# Patient Record
Sex: Female | Born: 1985 | Race: White | Hispanic: No | Marital: Married | Smoking: Never smoker
Health system: Southern US, Community
[De-identification: ages and names within clinical notes are randomized; demographics above are authoritative.]

## PROBLEM LIST (undated history)

## (undated) DIAGNOSIS — R51 Headache: Secondary | ICD-10-CM

## (undated) DIAGNOSIS — R87619 Unspecified abnormal cytological findings in specimens from cervix uteri: Secondary | ICD-10-CM

## (undated) DIAGNOSIS — R87629 Unspecified abnormal cytological findings in specimens from vagina: Secondary | ICD-10-CM

## (undated) DIAGNOSIS — Z8619 Personal history of other infectious and parasitic diseases: Secondary | ICD-10-CM

## (undated) DIAGNOSIS — B999 Unspecified infectious disease: Secondary | ICD-10-CM

## (undated) DIAGNOSIS — IMO0002 Reserved for concepts with insufficient information to code with codable children: Secondary | ICD-10-CM

## (undated) HISTORY — PX: CRYOTHERAPY: SHX1416

## (undated) HISTORY — PX: WISDOM TOOTH EXTRACTION: SHX21

## (undated) HISTORY — DX: Headache: R51

## (undated) HISTORY — DX: Personal history of other infectious and parasitic diseases: Z86.19

---
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
## 2007-08-09 ENCOUNTER — Inpatient Hospital Stay (HOSPITAL_COMMUNITY): Admission: AD | Admit: 2007-08-09 | Discharge: 2007-08-10 | Payer: Self-pay | Admitting: Obstetrics and Gynecology

## 2007-08-20 ENCOUNTER — Inpatient Hospital Stay (HOSPITAL_COMMUNITY): Admission: RE | Admit: 2007-08-20 | Discharge: 2007-08-22 | Payer: Self-pay | Admitting: Obstetrics and Gynecology

## 2010-02-06 NOTE — L&D Delivery Note (Signed)
Delivery Note At  a viable female was delivered via OA APGAR:9 9   Placenta status:del spontaneously and intact , .  Cord:  with the following complications: none  Anesthesia:  Epidural Episiotomy: none Lacerations: none Suture Repair: not needed Est. Blood Loss (mL): 300  Mom to postpartum.  Baby to nursery-stable.  Korri Ask L 12/15/2010, 3:13 PM

## 2010-05-10 LAB — RUBELLA ANTIBODY, IGM: Rubella: IMMUNE

## 2010-05-10 LAB — ABO/RH: RH Type: POSITIVE

## 2010-05-10 LAB — STREP B DNA PROBE: GBS: POSITIVE

## 2010-05-10 LAB — HEPATITIS B SURFACE ANTIGEN: Hepatitis B Surface Ag: NEGATIVE

## 2010-11-03 LAB — CBC
MCV: 100.2 — ABNORMAL HIGH
RBC: 3.21 — ABNORMAL LOW
WBC: 11.7 — ABNORMAL HIGH

## 2010-11-03 LAB — RPR: RPR Ser Ql: NONREACTIVE

## 2010-11-04 LAB — CBC
HCT: 26.9 — ABNORMAL LOW
Hemoglobin: 9.5 — ABNORMAL LOW
RDW: 13.2
WBC: 16.6 — ABNORMAL HIGH

## 2010-12-15 ENCOUNTER — Inpatient Hospital Stay (HOSPITAL_COMMUNITY): Payer: 59 | Admitting: Anesthesiology

## 2010-12-15 ENCOUNTER — Encounter (HOSPITAL_COMMUNITY): Payer: Self-pay | Admitting: Anesthesiology

## 2010-12-15 ENCOUNTER — Encounter (HOSPITAL_COMMUNITY): Payer: Self-pay

## 2010-12-15 ENCOUNTER — Inpatient Hospital Stay (HOSPITAL_COMMUNITY)
Admission: AD | Admit: 2010-12-15 | Discharge: 2010-12-17 | DRG: 775 | Disposition: A | Discharge status: Home / Self Care | Payer: 59 | Source: Ambulatory Visit | Attending: Obstetrics and Gynecology | Admitting: Obstetrics and Gynecology

## 2010-12-15 DIAGNOSIS — O480 Post-term pregnancy: Principal | ICD-10-CM | POA: Diagnosis present

## 2010-12-15 HISTORY — DX: Reserved for concepts with insufficient information to code with codable children: IMO0002

## 2010-12-15 HISTORY — DX: Unspecified abnormal cytological findings in specimens from cervix uteri: R87.619

## 2010-12-15 LAB — CBC
Hemoglobin: 11.4 g/dL — ABNORMAL LOW (ref 12.0–15.0)
MCH: 33.7 pg (ref 26.0–34.0)
MCV: 100 fL (ref 78.0–100.0)
RBC: 3.38 MIL/uL — ABNORMAL LOW (ref 3.87–5.11)

## 2010-12-15 MED ORDER — LACTATED RINGERS IV SOLN
INTRAVENOUS | Status: DC
  Administered 2010-12-15: 10:00:00 via INTRAVENOUS

## 2010-12-15 MED ORDER — DIPHENHYDRAMINE HCL 50 MG/ML IJ SOLN: 12.5000 mg | INTRAMUSCULAR | Status: DC | PRN

## 2010-12-15 MED ORDER — ALBUTEROL SULFATE HFA 108 (90 BASE) MCG/ACT IN AERS: 1.0000 | INHALATION_SPRAY | Freq: Four times a day (QID) | RESPIRATORY_TRACT | Status: DC | PRN

## 2010-12-15 MED ORDER — ONDANSETRON HCL 4 MG/2ML IJ SOLN: 4.0000 mg | INTRAMUSCULAR | Status: DC | PRN

## 2010-12-15 MED ORDER — WITCH HAZEL-GLYCERIN EX PADS: 1.0000 "application " | MEDICATED_PAD | CUTANEOUS | Status: DC | PRN

## 2010-12-15 MED ORDER — PRENATAL PLUS 27-1 MG PO TABS
1.0000 | ORAL_TABLET | Freq: Every day | ORAL | Status: DC
  Administered 2010-12-15 – 2010-12-16 (×2): 1 via ORAL
  Filled 2010-12-15 (×2): qty 1

## 2010-12-15 MED ORDER — TETANUS-DIPHTH-ACELL PERTUSSIS 5-2.5-18.5 LF-MCG/0.5 IM SUSP
0.5000 mL | Freq: Once | INTRAMUSCULAR | Status: DC
  Filled 2010-12-15: qty 0.5

## 2010-12-15 MED ORDER — LACTATED RINGERS IV SOLN
500.0000 mL | Freq: Once | INTRAVENOUS | Status: AC
  Administered 2010-12-15: 500 mL via INTRAVENOUS

## 2010-12-15 MED ORDER — MEASLES, MUMPS & RUBELLA VAC ~~LOC~~ INJ: 0.5000 mL | INJECTION | Freq: Once | SUBCUTANEOUS | Status: DC

## 2010-12-15 MED ORDER — LIDOCAINE HCL (PF) 1 % IJ SOLN
30.0000 mL | INTRAMUSCULAR | Status: DC | PRN
  Filled 2010-12-15: qty 30

## 2010-12-15 MED ORDER — OXYCODONE-ACETAMINOPHEN 5-325 MG PO TABS: 2.0000 | ORAL_TABLET | ORAL | Status: DC | PRN

## 2010-12-15 MED ORDER — DIBUCAINE 1 % RE OINT: 1.0000 "application " | TOPICAL_OINTMENT | RECTAL | Status: DC | PRN

## 2010-12-15 MED ORDER — FLEET ENEMA 7-19 GM/118ML RE ENEM: 1.0000 | ENEMA | RECTAL | Status: DC | PRN

## 2010-12-15 MED ORDER — MEDROXYPROGESTERONE ACETATE 150 MG/ML IM SUSP: 150.0000 mg | INTRAMUSCULAR | Status: DC | PRN

## 2010-12-15 MED ORDER — LACTATED RINGERS IV SOLN
INTRAVENOUS | Status: DC
  Administered 2010-12-15: 14:00:00 via INTRAVENOUS

## 2010-12-15 MED ORDER — EPHEDRINE 5 MG/ML INJ: 10.0000 mg | INTRAVENOUS | Status: DC | PRN

## 2010-12-15 MED ORDER — DIPHENHYDRAMINE HCL 25 MG PO CAPS: 25.0000 mg | ORAL_CAPSULE | Freq: Four times a day (QID) | ORAL | Status: DC | PRN

## 2010-12-15 MED ORDER — BISACODYL 10 MG RE SUPP: 10.0000 mg | Freq: Every day | RECTAL | Status: DC | PRN

## 2010-12-15 MED ORDER — SIMETHICONE 80 MG PO CHEW: 80.0000 mg | CHEWABLE_TABLET | ORAL | Status: DC | PRN

## 2010-12-15 MED ORDER — PHENYLEPHRINE 40 MCG/ML (10ML) SYRINGE FOR IV PUSH (FOR BLOOD PRESSURE SUPPORT)
PREFILLED_SYRINGE | INTRAVENOUS | Status: AC
  Filled 2010-12-15: qty 5

## 2010-12-15 MED ORDER — ACETAMINOPHEN 325 MG PO TABS: 650.0000 mg | ORAL_TABLET | ORAL | Status: DC | PRN

## 2010-12-15 MED ORDER — PHENYLEPHRINE 40 MCG/ML (10ML) SYRINGE FOR IV PUSH (FOR BLOOD PRESSURE SUPPORT): 80.0000 ug | PREFILLED_SYRINGE | INTRAVENOUS | Status: DC | PRN

## 2010-12-15 MED ORDER — FENTANYL 2.5 MCG/ML BUPIVACAINE 1/10 % EPIDURAL INFUSION (WH - ANES): 14.0000 mL/h | INTRAMUSCULAR | Status: DC

## 2010-12-15 MED ORDER — EPHEDRINE 5 MG/ML INJ
INTRAVENOUS | Status: AC
  Filled 2010-12-15: qty 4

## 2010-12-15 MED ORDER — SODIUM CHLORIDE 0.9 % IV SOLN
1.0000 g | Freq: Four times a day (QID) | INTRAVENOUS | Status: DC
  Administered 2010-12-15: 1 g via INTRAVENOUS
  Filled 2010-12-15 (×3): qty 1000

## 2010-12-15 MED ORDER — LANOLIN HYDROUS EX OINT: TOPICAL_OINTMENT | CUTANEOUS | Status: DC | PRN

## 2010-12-15 MED ORDER — CITRIC ACID-SODIUM CITRATE 334-500 MG/5ML PO SOLN: 30.0000 mL | ORAL | Status: DC | PRN

## 2010-12-15 MED ORDER — ZOLPIDEM TARTRATE 5 MG PO TABS: 5.0000 mg | ORAL_TABLET | Freq: Every evening | ORAL | Status: DC | PRN

## 2010-12-15 MED ORDER — FENTANYL 2.5 MCG/ML BUPIVACAINE 1/10 % EPIDURAL INFUSION (WH - ANES)
INTRAMUSCULAR | Status: AC
  Filled 2010-12-15: qty 60

## 2010-12-15 MED ORDER — CARBOPROST TROMETHAMINE 250 MCG/ML IM SOLN
250.0000 ug | Freq: Once | INTRAMUSCULAR | Status: AC
  Administered 2010-12-15: 250 ug via INTRAMUSCULAR

## 2010-12-15 MED ORDER — SODIUM BICARBONATE 8.4 % IV SOLN
INTRAVENOUS | Status: DC | PRN
  Administered 2010-12-15: 5 mL via EPIDURAL

## 2010-12-15 MED ORDER — CARBOPROST TROMETHAMINE 250 MCG/ML IM SOLN
INTRAMUSCULAR | Status: AC
  Administered 2010-12-15: 250 ug via INTRAMUSCULAR
  Filled 2010-12-15: qty 1

## 2010-12-15 MED ORDER — LACTATED RINGERS IV SOLN: 500.0000 mL | INTRAVENOUS | Status: DC | PRN

## 2010-12-15 MED ORDER — IBUPROFEN 600 MG PO TABS
600.0000 mg | ORAL_TABLET | Freq: Four times a day (QID) | ORAL | Status: DC
  Administered 2010-12-15 – 2010-12-17 (×7): 600 mg via ORAL
  Filled 2010-12-15 (×7): qty 1

## 2010-12-15 MED ORDER — IBUPROFEN 600 MG PO TABS: 600.0000 mg | ORAL_TABLET | Freq: Four times a day (QID) | ORAL | Status: DC | PRN

## 2010-12-15 MED ORDER — OXYTOCIN BOLUS FROM INFUSION
500.0000 mL | Freq: Once | INTRAVENOUS | Status: DC
  Filled 2010-12-15: qty 1000
  Filled 2010-12-15: qty 500

## 2010-12-15 MED ORDER — ONDANSETRON HCL 4 MG/2ML IJ SOLN: 4.0000 mg | Freq: Four times a day (QID) | INTRAMUSCULAR | Status: DC | PRN

## 2010-12-15 MED ORDER — OXYCODONE-ACETAMINOPHEN 5-325 MG PO TABS: 1.0000 | ORAL_TABLET | ORAL | Status: DC | PRN

## 2010-12-15 MED ORDER — OXYTOCIN 20 UNITS IN LACTATED RINGERS INFUSION - SIMPLE
125.0000 mL/h | Freq: Once | INTRAVENOUS | Status: AC
  Administered 2010-12-15: 999 mL/h via INTRAVENOUS

## 2010-12-15 MED ORDER — FENTANYL 2.5 MCG/ML BUPIVACAINE 1/10 % EPIDURAL INFUSION (WH - ANES)
INTRAMUSCULAR | Status: DC | PRN
  Administered 2010-12-15: 14 mL/h via EPIDURAL

## 2010-12-15 MED ORDER — SENNOSIDES-DOCUSATE SODIUM 8.6-50 MG PO TABS
2.0000 | ORAL_TABLET | Freq: Every day | ORAL | Status: DC
  Administered 2010-12-15 – 2010-12-16 (×2): 2 via ORAL

## 2010-12-15 MED ORDER — ONDANSETRON HCL 4 MG PO TABS: 4.0000 mg | ORAL_TABLET | ORAL | Status: DC | PRN

## 2010-12-15 MED ORDER — METHYLERGONOVINE MALEATE 0.2 MG/ML IJ SOLN
0.2000 mg | Freq: Once | INTRAMUSCULAR | Status: AC
  Administered 2010-12-15: 0.2 mg via INTRAMUSCULAR

## 2010-12-15 MED ORDER — BENZOCAINE-MENTHOL 20-0.5 % EX AERO: 1.0000 "application " | INHALATION_SPRAY | CUTANEOUS | Status: DC | PRN

## 2010-12-15 MED ORDER — METHYLERGONOVINE MALEATE 0.2 MG/ML IJ SOLN
INTRAMUSCULAR | Status: AC
  Administered 2010-12-15: 0.2 mg via INTRAMUSCULAR
  Filled 2010-12-15: qty 1

## 2010-12-15 NOTE — Anesthesia Procedure Notes (Signed)

## 2010-12-15 NOTE — Progress Notes (Signed)
Patient states she is having contractions every 10 minutes. Dr. Vincente Poli told the patient to come in. Denies any bleeding and slight discharge. Reports good fetal movement. Was 4 cm at exam 11-7.

## 2010-12-15 NOTE — Anesthesia Preprocedure Evaluation (Signed)
Anesthesia Evaluation  Patient identified by MRN, date of birth, ID band Patient awake    Reviewed: Allergy & Precautions, H&P , Patient's Chart, lab work & pertinent test results  Airway Mallampati: II TM Distance: >3 FB Neck ROM: full    Dental  (+) Teeth Intact   Pulmonary asthma (rare inhalewr use) ,  clear to auscultation        Cardiovascular regular Normal    Neuro/Psych    GI/Hepatic   Endo/Other    Renal/GU      Musculoskeletal   Abdominal   Peds  Hematology   Anesthesia Other Findings       Reproductive/Obstetrics (+) Pregnancy                           Anesthesia Physical Anesthesia Plan  ASA: II  Anesthesia Plan: Epidural   Post-op Pain Management:    Induction:   Airway Management Planned:   Additional Equipment:   Intra-op Plan:   Post-operative Plan:   Informed Consent: I have reviewed the patients History and Physical, chart, labs and discussed the procedure including the risks, benefits and alternatives for the proposed anesthesia with the patient or authorized representative who has indicated his/her understanding and acceptance.   Dental Advisory Given  Plan Discussed with:   Anesthesia Plan Comments: (Labs checked- platelets confirmed with RN in room. Fetal heart tracing, per RN, reported to be stable enough for sitting procedure. Discussed epidural, and patient consents to the procedure:  included risk of possible headache,backache, failed block, allergic reaction, and nerve injury. This patient was asked if she had any questions or concerns before the procedure started. )        Anesthesia Quick Evaluation

## 2010-12-15 NOTE — H&P (Signed)
25 year old Gravida 1 Para 0 at 41 weeks presents complaining of contractions  Prenatal care See [place] GBBS is negative  AFebrile Vital signs are stable Fetal heart rate is reactive General alert and oriented Lung CTAB Car regular rate and rhythm Leopolds 7 1/2 pound Cervix 90%/ 5 cm/-2 BOWI  IMPRESSION: IUP at 41 weeks Post dates  PLAN: Admit AROM

## 2010-12-16 LAB — CBC
HCT: 28.4 % — ABNORMAL LOW (ref 36.0–46.0)
Platelets: 163 10*3/uL (ref 150–400)
RDW: 12.8 % (ref 11.5–15.5)
WBC: 14.9 10*3/uL — ABNORMAL HIGH (ref 4.0–10.5)

## 2010-12-16 NOTE — Progress Notes (Signed)
Post Partum Day 1 Subjective: no complaints, up ad lib and voiding  Objective: Blood pressure 110/74, pulse 90, temperature 98 F (36.7 C), temperature source Oral, resp. rate 18, height 5\' 8"  (1.727 m), weight 78.744 kg (173 lb 9.6 oz), SpO2 100.00%, unknown if currently breastfeeding.  Physical Exam:  General: alert and cooperative Lochia: appropriate Uterine Fundus: firm Perineum intact DVT Evaluation: No evidence of DVT seen on physical exam.   Basename 12/16/10 0540 12/15/10 1000  HGB 9.5* 11.4*  HCT 28.4* 33.8*    Assessment/Plan: Plan for discharge tomorrow   LOS: 1 day   CURTIS,CAROL G 12/16/2010, 8:39 AM

## 2010-12-16 NOTE — Anesthesia Postprocedure Evaluation (Signed)
  Anesthesia Post-op Note  Patient: Yesenia Baker  Procedure(s) Performed: * No procedures listed *  Patient Location: Mother/Baby  Anesthesia Type: Epidural  Level of Consciousness: awake, alert  and oriented  Airway and Oxygen Therapy: Patient Spontanous Breathing  Post-op Pain: none  Post-op Assessment: Post-op Vital signs reviewed and Patient's Cardiovascular Status Stable  Post-op Vital Signs: Reviewed and stable  Complications: No apparent anesthesia complications

## 2010-12-17 NOTE — Progress Notes (Signed)
Post Partum Day 2 Subjective: no complaints, up ad lib and tolerating PO  Objective: Blood pressure 108/68, pulse 80, temperature 98.1 F (36.7 C), temperature source Oral, resp. rate 20, height 5\' 8"  (1.727 m), weight 78.744 kg (173 lb 9.6 oz), SpO2 100.00%, unknown if currently breastfeeding.  Physical Exam:  General: alert, cooperative and no distress Lochia: appropriate Uterine Fundus: firm Incision: none DVT Evaluation: No evidence of DVT seen on physical exam.   Basename 12/16/10 0540 12/15/10 1000  HGB 9.5* 11.4*  HCT 28.4* 33.8*    Assessment/Plan: Discharge home   LOS: 2 days   Juleon Narang C 12/17/2010, 8:43 AM

## 2010-12-19 NOTE — Discharge Summary (Signed)
Obstetric Discharge Summary Reason for Admission: rupture of membranes Prenatal Procedures: ultrasound Intrapartum Procedures: spontaneous vaginal delivery Postpartum Procedures: none Complications-Operative and Postpartum: none Hemoglobin  Date Value Range Status  12/16/2010 9.5* 12.0-15.0 (Baker/dL) Final     HCT  Date Value Range Status  12/16/2010 28.4* 36.0-46.0 (%) Final    Discharge Diagnoses: Term Pregnancy-delivered  Discharge Information: Date: 12/19/2010 Activity: pelvic rest Diet: routine Medications: PNV, Ibuprofen and Percocet Condition: stable Instructions: refer to practice specific booklet Discharge to: home   Newborn Data: Live born female  Birth Weight: 8 lb 5.7 oz (3790 Baker) APGAR: 8, 9  Home with mother.  Yesenia Baker 12/19/2010, 8:13 AM

## 2012-01-16 LAB — OB RESULTS CONSOLE HEPATITIS B SURFACE ANTIGEN: Hepatitis B Surface Ag: NEGATIVE

## 2012-01-16 LAB — OB RESULTS CONSOLE GC/CHLAMYDIA: Chlamydia: NEGATIVE

## 2012-01-16 LAB — OB RESULTS CONSOLE RPR: RPR: NONREACTIVE

## 2012-01-16 LAB — OB RESULTS CONSOLE HIV ANTIBODY (ROUTINE TESTING): HIV: NONREACTIVE

## 2012-01-16 LAB — OB RESULTS CONSOLE RUBELLA ANTIBODY, IGM: Rubella: IMMUNE

## 2012-02-07 NOTE — L&D Delivery Note (Signed)
Delivery Note At 2:07 PM a viable female was delivered via Vaginal, Spontaneous Delivery (Presentation:OA  ;  ).  APGAR: 9/9 , ; weight .   Placenta status:spontintact  , .  Cord:  with the following complications: .  Cord pH: not sent  Anesthesia: Epidural  Episiotomy: none Lacerations: none Suture Repair: n/a Est. Blood Loss (mL): 300  Mom to postpartum.  Baby to nursery-stable.  Meriel Pica 08/31/2012, 2:18 PM

## 2012-05-02 ENCOUNTER — Ambulatory Visit: Payer: Self-pay

## 2012-05-02 ENCOUNTER — Other Ambulatory Visit: Payer: Self-pay | Admitting: Occupational Medicine

## 2012-05-02 DIAGNOSIS — R7612 Nonspecific reaction to cell mediated immunity measurement of gamma interferon antigen response without active tuberculosis: Secondary | ICD-10-CM

## 2012-08-25 ENCOUNTER — Encounter (HOSPITAL_COMMUNITY): Payer: Self-pay

## 2012-08-25 ENCOUNTER — Inpatient Hospital Stay (HOSPITAL_COMMUNITY)
Admission: AD | Admit: 2012-08-25 | Discharge: 2012-08-25 | Disposition: A | Discharge status: Home / Self Care | Payer: 59 | Source: Ambulatory Visit | Attending: Obstetrics and Gynecology | Admitting: Obstetrics and Gynecology

## 2012-08-25 DIAGNOSIS — R51 Headache: Secondary | ICD-10-CM | POA: Insufficient documentation

## 2012-08-25 DIAGNOSIS — O99891 Other specified diseases and conditions complicating pregnancy: Secondary | ICD-10-CM | POA: Insufficient documentation

## 2012-08-25 DIAGNOSIS — O26893 Other specified pregnancy related conditions, third trimester: Secondary | ICD-10-CM

## 2012-08-25 DIAGNOSIS — O9989 Other specified diseases and conditions complicating pregnancy, childbirth and the puerperium: Secondary | ICD-10-CM

## 2012-08-25 LAB — CBC
Hemoglobin: 10.2 g/dL — ABNORMAL LOW (ref 12.0–15.0)
MCHC: 33.7 g/dL (ref 30.0–36.0)
RDW: 13.4 % (ref 11.5–15.5)
WBC: 6.5 10*3/uL (ref 4.0–10.5)

## 2012-08-25 LAB — URINALYSIS, ROUTINE W REFLEX MICROSCOPIC
Ketones, ur: NEGATIVE mg/dL
Leukocytes, UA: NEGATIVE
Nitrite: NEGATIVE
pH: 7.5 (ref 5.0–8.0)

## 2012-08-25 LAB — COMPREHENSIVE METABOLIC PANEL
ALT: 12 U/L (ref 0–35)
Albumin: 2.2 g/dL — ABNORMAL LOW (ref 3.5–5.2)
Alkaline Phosphatase: 224 U/L — ABNORMAL HIGH (ref 39–117)
Chloride: 102 mEq/L (ref 96–112)
Glucose, Bld: 97 mg/dL (ref 70–99)
Potassium: 3.6 mEq/L (ref 3.5–5.1)
Sodium: 135 mEq/L (ref 135–145)
Total Protein: 5.8 g/dL — ABNORMAL LOW (ref 6.0–8.3)

## 2012-08-25 MED ORDER — METOCLOPRAMIDE HCL 5 MG/ML IJ SOLN
10.0000 mg | Freq: Once | INTRAMUSCULAR | Status: AC
  Administered 2012-08-25: 10 mg via INTRAVENOUS
  Filled 2012-08-25: qty 2

## 2012-08-25 MED ORDER — SODIUM CHLORIDE 0.9 % IV SOLN
999.0000 mL | Freq: Once | INTRAVENOUS | Status: AC
  Administered 2012-08-25: 999 mL via INTRAVENOUS

## 2012-08-25 MED ORDER — DEXAMETHASONE SODIUM PHOSPHATE 10 MG/ML IJ SOLN
10.0000 mg | Freq: Once | INTRAMUSCULAR | Status: AC
  Administered 2012-08-25: 10 mg via INTRAVENOUS
  Filled 2012-08-25: qty 1

## 2012-08-25 MED ORDER — DIPHENHYDRAMINE HCL 50 MG/ML IJ SOLN
25.0000 mg | Freq: Once | INTRAMUSCULAR | Status: AC
  Administered 2012-08-25: 25 mg via INTRAVENOUS
  Filled 2012-08-25: qty 1

## 2012-08-25 NOTE — MAU Note (Signed)
Pt reports taking a total of 2 grams of tylenol throughout the day without relief.

## 2012-08-25 NOTE — MAU Note (Signed)
No leaking of fluid or vaginal bleeding. Some irregular contractions. Told to come in from office for headache. Denies problems with BP in office. Denies history of migraines.

## 2012-08-25 NOTE — MAU Provider Note (Signed)
History     CSN: [id number]  Arrival date and time: 08/25/12 6387   First Provider Initiated Contact with Patient 08/25/12 2027      Chief Complaint  Patient presents with  . Headache   Headache     Yesenia Baker is a 27 y.o. G3P2002 at [redacted]w[redacted]d who presents today with a headache. She states that the headache started this morning upon rising, and she rates it a 6/10. The headache is primarily frontal, and she does report a hx of allergies. However, she has been congested throughout her entire pregnancy. She has tried two doses of Tylenol today without any relief. She denies any complications with the pregnancy or with prior pregnancies. She denies visual disturbances, RUQ or edema. She denies UCs, LOF or VB and confirms fetal movement. She denies any hx of migraine headaches.   Past Medical History  Diagnosis Date  . Abnormal Pap smear     2007  . Asthma     albuterol prn    Past Surgical History  Procedure Laterality Date  . Wisdom tooth extraction    . Cryotherapy  cervix-2007  . Leep  2007    Family History  Problem Relation Age of Onset  . Anesthesia problems Neg Hx   . Hypotension Neg Hx   . Malignant hyperthermia Neg Hx   . Pseudochol deficiency Neg Hx   . Diabetes Maternal Grandmother   . Hypertension Maternal Grandmother   . Diabetes Maternal Grandfather   . Hypertension Maternal Grandfather     History  Substance Use Topics  . Smoking status: Never Smoker   . Smokeless tobacco: Never Used  . Alcohol Use: No    Allergies:  Allergies  Allergen Reactions  . Ceclor (Cefaclor) Rash    Prescriptions prior to admission  Medication Sig Dispense Refill  . prenatal vitamin w/FE, FA (PRENATAL 1 + 1) 27-1 MG TABS Take 1 tablet by mouth daily.        Marland Kitchen albuterol (PROVENTIL HFA;VENTOLIN HFA) 108 (90 BASE) MCG/ACT inhaler Inhale into the lungs every 6 (six) hours as needed. For asthma       . Alum Hydroxide-Mag Carbonate (GAVISCON EXTRA RELIEF FORMULA)  160-105 MG CHEW Chew 1 tablet by mouth daily as needed. For heartburn       . ferrous sulfate 325 (65 FE) MG tablet Take 325 mg by mouth daily with breakfast.          Review of Systems  Neurological: Positive for headaches.   Physical Exam   Blood pressure 117/70, pulse 79, temperature 98.1 F (36.7 C), temperature source Oral, resp. rate 16, height 5\' 8"  (1.727 m), weight 78.2 kg (172 lb 6.4 oz).  Physical Exam  Nursing note and vitals reviewed. Constitutional: She is oriented to person, place, and time. She appears well-developed and well-nourished. No distress.  Cardiovascular: Normal rate and normal heart sounds.   Respiratory: Effort normal and breath sounds normal.  GI: Soft. There is no tenderness.  Musculoskeletal: She exhibits no edema.  Neurological: She is alert and oriented to person, place, and time. She has normal reflexes.  Skin: Skin is warm and dry.  Psychiatric: She has a normal mood and affect.   FHT: 140, moderate with 15x15 accels, no decels Toco: No UCs MAU Course  Procedures  Results for orders placed during the hospital encounter of 08/25/12 (from the past 24 hour(s))  URINALYSIS, ROUTINE W REFLEX MICROSCOPIC     Status: None   Collection Time  08/25/12  8:04 PM      Result Value Range   Color, Urine YELLOW  YELLOW   APPearance CLEAR  CLEAR   Specific Gravity, Urine 1.010  1.005 - 1.030   pH 7.5  5.0 - 8.0   Glucose, UA NEGATIVE  NEGATIVE mg/dL   Hgb urine dipstick NEGATIVE  NEGATIVE   Bilirubin Urine NEGATIVE  NEGATIVE   Ketones, ur NEGATIVE  NEGATIVE mg/dL   Protein, ur NEGATIVE  NEGATIVE mg/dL   Urobilinogen, UA 0.2  0.0 - 1.0 mg/dL   Nitrite NEGATIVE  NEGATIVE   Leukocytes, UA NEGATIVE  NEGATIVE  CBC     Status: Abnormal   Collection Time    08/25/12  9:00 PM      Result Value Range   WBC 6.5  4.0 - 10.5 K/uL   RBC 3.11 (*) 3.87 - 5.11 MIL/uL   Hemoglobin 10.2 (*) 12.0 - 15.0 g/dL   HCT 16.1 (*) 09.6 - 04.5 %   MCV 97.4  78.0 - 100.0  fL   MCH 32.8  26.0 - 34.0 pg   MCHC 33.7  30.0 - 36.0 g/dL   RDW 40.9  81.1 - 91.4 %   Platelets 204  150 - 400 K/uL  COMPREHENSIVE METABOLIC PANEL     Status: Abnormal   Collection Time    08/25/12  9:00 PM      Result Value Range   Sodium 135  135 - 145 mEq/L   Potassium 3.6  3.5 - 5.1 mEq/L   Chloride 102  96 - 112 mEq/L   CO2 22  19 - 32 mEq/L   Glucose, Bld 97  70 - 99 mg/dL   BUN 6  6 - 23 mg/dL   Creatinine, Ser 7.82  0.50 - 1.10 mg/dL   Calcium 8.6  8.4 - 95.6 mg/dL   Total Protein 5.8 (*) 6.0 - 8.3 g/dL   Albumin 2.2 (*) 3.5 - 5.2 g/dL   AST 24  0 - 37 U/L   ALT 12  0 - 35 U/L   Alkaline Phosphatase 224 (*) 39 - 117 U/L   Total Bilirubin 0.2 (*) 0.3 - 1.2 mg/dL   GFR calc non Af Amer >90  >90 mL/min   GFR calc Af Amer >90  >90 mL/min  URIC ACID     Status: None   Collection Time    08/25/12  9:00 PM      Result Value Range   Uric Acid, Serum 4.7  2.4 - 7.0 mg/dL   2130: Spoke with Dr. Henderson Cloud, will do [facility] labs, and give migraine cocktail. Plan to check cervix as well. If labs are normal and headache is improved with meds we can DC home.   2202: Headache is now 1/10. Pt is feeling much better. Cervix: 1.5/thick Assessment and Plan   1. Headache in pregnancy, antepartum, third trimester    Pre-X danger signs reviewed Labor precautions FU wiht the office as planned   Tawnya Crook 08/25/2012, 10:14 PM

## 2012-08-30 ENCOUNTER — Telehealth (HOSPITAL_COMMUNITY): Payer: Self-pay | Admitting: *Deleted

## 2012-08-30 ENCOUNTER — Encounter (HOSPITAL_COMMUNITY): Payer: Self-pay | Admitting: *Deleted

## 2012-08-30 NOTE — Telephone Encounter (Signed)
Preadmission screen  

## 2012-08-31 ENCOUNTER — Inpatient Hospital Stay (HOSPITAL_COMMUNITY)
Admission: AD | Admit: 2012-08-31 | Discharge: 2012-09-02 | DRG: 775 | Disposition: A | Discharge status: Home / Self Care | Payer: 59 | Source: Ambulatory Visit | Attending: Obstetrics and Gynecology | Admitting: Obstetrics and Gynecology

## 2012-08-31 ENCOUNTER — Encounter (HOSPITAL_COMMUNITY): Payer: Self-pay | Admitting: *Deleted

## 2012-08-31 ENCOUNTER — Inpatient Hospital Stay (HOSPITAL_COMMUNITY): Payer: 59 | Admitting: Anesthesiology

## 2012-08-31 ENCOUNTER — Encounter (HOSPITAL_COMMUNITY): Payer: Self-pay | Admitting: Anesthesiology

## 2012-08-31 DIAGNOSIS — O99892 Other specified diseases and conditions complicating childbirth: Principal | ICD-10-CM | POA: Diagnosis present

## 2012-08-31 DIAGNOSIS — Z22330 Carrier of Group B streptococcus: Secondary | ICD-10-CM

## 2012-08-31 LAB — CBC
HCT: 32 % — ABNORMAL LOW (ref 36.0–46.0)
MCH: 32.6 pg (ref 26.0–34.0)
MCV: 95 fL (ref 78.0–100.0)
Platelets: 211 10*3/uL (ref 150–400)
RBC: 3.37 MIL/uL — ABNORMAL LOW (ref 3.87–5.11)

## 2012-08-31 LAB — TYPE AND SCREEN: Antibody Screen: NEGATIVE

## 2012-08-31 MED ORDER — FENTANYL 2.5 MCG/ML BUPIVACAINE 1/10 % EPIDURAL INFUSION (WH - ANES)
INTRAMUSCULAR | Status: AC
  Administered 2012-08-31: 14 mL/h via EPIDURAL
  Filled 2012-08-31: qty 125

## 2012-08-31 MED ORDER — WITCH HAZEL-GLYCERIN EX PADS: 1.0000 "application " | MEDICATED_PAD | CUTANEOUS | Status: DC | PRN

## 2012-08-31 MED ORDER — ACETAMINOPHEN 325 MG PO TABS: 650.0000 mg | ORAL_TABLET | ORAL | Status: DC | PRN

## 2012-08-31 MED ORDER — PHENYLEPHRINE 40 MCG/ML (10ML) SYRINGE FOR IV PUSH (FOR BLOOD PRESSURE SUPPORT)
PREFILLED_SYRINGE | INTRAVENOUS | Status: AC
  Filled 2012-08-31: qty 5

## 2012-08-31 MED ORDER — SENNOSIDES-DOCUSATE SODIUM 8.6-50 MG PO TABS
2.0000 | ORAL_TABLET | Freq: Every day | ORAL | Status: DC
  Administered 2012-08-31 – 2012-09-01 (×2): 2 via ORAL

## 2012-08-31 MED ORDER — PSEUDOEPHEDRINE HCL 30 MG PO TABS: 30.0000 mg | ORAL_TABLET | ORAL | Status: DC | PRN

## 2012-08-31 MED ORDER — FENTANYL 2.5 MCG/ML BUPIVACAINE 1/10 % EPIDURAL INFUSION (WH - ANES): 14.0000 mL/h | INTRAMUSCULAR | Status: DC | PRN

## 2012-08-31 MED ORDER — PRENATAL MULTIVITAMIN CH
1.0000 | ORAL_TABLET | Freq: Every day | ORAL | Status: DC
  Administered 2012-09-01 – 2012-09-02 (×2): 1 via ORAL
  Filled 2012-08-31 (×2): qty 1

## 2012-08-31 MED ORDER — EPHEDRINE 5 MG/ML INJ
INTRAVENOUS | Status: AC
  Filled 2012-08-31: qty 4

## 2012-08-31 MED ORDER — IBUPROFEN 600 MG PO TABS: 600.0000 mg | ORAL_TABLET | Freq: Four times a day (QID) | ORAL | Status: DC | PRN

## 2012-08-31 MED ORDER — FLEET ENEMA 7-19 GM/118ML RE ENEM: 1.0000 | ENEMA | RECTAL | Status: DC | PRN

## 2012-08-31 MED ORDER — FLEET ENEMA 7-19 GM/118ML RE ENEM: 1.0000 | ENEMA | Freq: Every day | RECTAL | Status: DC | PRN

## 2012-08-31 MED ORDER — MEASLES, MUMPS & RUBELLA VAC ~~LOC~~ INJ: 0.5000 mL | INJECTION | Freq: Once | SUBCUTANEOUS | Status: DC

## 2012-08-31 MED ORDER — ONDANSETRON HCL 4 MG PO TABS: 4.0000 mg | ORAL_TABLET | ORAL | Status: DC | PRN

## 2012-08-31 MED ORDER — LIDOCAINE HCL (PF) 1 % IJ SOLN
30.0000 mL | INTRAMUSCULAR | Status: DC | PRN
  Filled 2012-08-31: qty 30

## 2012-08-31 MED ORDER — OXYCODONE-ACETAMINOPHEN 5-325 MG PO TABS: 1.0000 | ORAL_TABLET | Freq: Four times a day (QID) | ORAL | Status: DC | PRN

## 2012-08-31 MED ORDER — BENZOCAINE-MENTHOL 20-0.5 % EX AERO: 1.0000 "application " | INHALATION_SPRAY | CUTANEOUS | Status: DC | PRN

## 2012-08-31 MED ORDER — OXYTOCIN BOLUS FROM INFUSION: 500.0000 mL | INTRAVENOUS | Status: DC

## 2012-08-31 MED ORDER — SODIUM BICARBONATE 8.4 % IV SOLN
INTRAVENOUS | Status: DC | PRN
  Administered 2012-08-31: 5 mL via EPIDURAL

## 2012-08-31 MED ORDER — MISOPROSTOL 200 MCG PO TABS
ORAL_TABLET | ORAL | Status: AC
  Filled 2012-08-31: qty 5

## 2012-08-31 MED ORDER — LACTATED RINGERS IV SOLN
INTRAVENOUS | Status: DC
  Administered 2012-08-31: 11:00:00 via INTRAVENOUS

## 2012-08-31 MED ORDER — LACTATED RINGERS IV SOLN: 500.0000 mL | INTRAVENOUS | Status: DC | PRN

## 2012-08-31 MED ORDER — LORATADINE 10 MG PO TABS
10.0000 mg | ORAL_TABLET | Freq: Every day | ORAL | Status: DC
  Administered 2012-08-31 – 2012-09-02 (×3): 10 mg via ORAL
  Filled 2012-08-31 (×3): qty 1

## 2012-08-31 MED ORDER — PHENYLEPHRINE 40 MCG/ML (10ML) SYRINGE FOR IV PUSH (FOR BLOOD PRESSURE SUPPORT)
80.0000 ug | PREFILLED_SYRINGE | INTRAVENOUS | Status: DC | PRN
  Filled 2012-08-31: qty 2

## 2012-08-31 MED ORDER — EPHEDRINE 5 MG/ML INJ
10.0000 mg | INTRAVENOUS | Status: DC | PRN
  Filled 2012-08-31: qty 2

## 2012-08-31 MED ORDER — DIPHENHYDRAMINE HCL 25 MG PO CAPS: 25.0000 mg | ORAL_CAPSULE | Freq: Four times a day (QID) | ORAL | Status: DC | PRN

## 2012-08-31 MED ORDER — DEXTROSE 5 % IV SOLN
5.0000 10*6.[IU] | Freq: Once | INTRAVENOUS | Status: AC
  Administered 2012-08-31: 5 10*6.[IU] via INTRAVENOUS
  Filled 2012-08-31: qty 5

## 2012-08-31 MED ORDER — ZOLPIDEM TARTRATE 5 MG PO TABS: 5.0000 mg | ORAL_TABLET | Freq: Every evening | ORAL | Status: DC | PRN

## 2012-08-31 MED ORDER — LACTATED RINGERS IV SOLN: 500.0000 mL | Freq: Once | INTRAVENOUS | Status: DC

## 2012-08-31 MED ORDER — PENICILLIN G POTASSIUM 5000000 UNITS IJ SOLR
2.5000 10*6.[IU] | INTRAMUSCULAR | Status: DC
  Filled 2012-08-31 (×4): qty 2.5

## 2012-08-31 MED ORDER — BISACODYL 10 MG RE SUPP: 10.0000 mg | Freq: Every day | RECTAL | Status: DC | PRN

## 2012-08-31 MED ORDER — TETANUS-DIPHTH-ACELL PERTUSSIS 5-2.5-18.5 LF-MCG/0.5 IM SUSP: 0.5000 mL | Freq: Once | INTRAMUSCULAR | Status: DC

## 2012-08-31 MED ORDER — SIMETHICONE 80 MG PO CHEW: 80.0000 mg | CHEWABLE_TABLET | ORAL | Status: DC | PRN

## 2012-08-31 MED ORDER — DIBUCAINE 1 % RE OINT: 1.0000 "application " | TOPICAL_OINTMENT | RECTAL | Status: DC | PRN

## 2012-08-31 MED ORDER — ONDANSETRON HCL 4 MG/2ML IJ SOLN: 4.0000 mg | INTRAMUSCULAR | Status: DC | PRN

## 2012-08-31 MED ORDER — OXYCODONE-ACETAMINOPHEN 5-325 MG PO TABS: 1.0000 | ORAL_TABLET | ORAL | Status: DC | PRN

## 2012-08-31 MED ORDER — OXYTOCIN 40 UNITS IN LACTATED RINGERS INFUSION - SIMPLE MED
62.5000 mL/h | INTRAVENOUS | Status: DC
  Administered 2012-08-31: 62.5 mL/h via INTRAVENOUS
  Filled 2012-08-31: qty 1000

## 2012-08-31 MED ORDER — LANOLIN HYDROUS EX OINT: TOPICAL_OINTMENT | CUTANEOUS | Status: DC | PRN

## 2012-08-31 MED ORDER — CITRIC ACID-SODIUM CITRATE 334-500 MG/5ML PO SOLN: 30.0000 mL | ORAL | Status: DC | PRN

## 2012-08-31 MED ORDER — ALBUTEROL SULFATE HFA 108 (90 BASE) MCG/ACT IN AERS: 2.0000 | INHALATION_SPRAY | Freq: Four times a day (QID) | RESPIRATORY_TRACT | Status: DC | PRN

## 2012-08-31 MED ORDER — ONDANSETRON HCL 4 MG/2ML IJ SOLN: 4.0000 mg | Freq: Four times a day (QID) | INTRAMUSCULAR | Status: DC | PRN

## 2012-08-31 MED ORDER — IBUPROFEN 800 MG PO TABS
800.0000 mg | ORAL_TABLET | Freq: Three times a day (TID) | ORAL | Status: DC | PRN
  Administered 2012-08-31 – 2012-09-02 (×5): 800 mg via ORAL
  Filled 2012-08-31 (×5): qty 1

## 2012-08-31 MED ORDER — DIPHENHYDRAMINE HCL 50 MG/ML IJ SOLN: 12.5000 mg | INTRAMUSCULAR | Status: DC | PRN

## 2012-08-31 NOTE — Anesthesia Preprocedure Evaluation (Signed)

## 2012-08-31 NOTE — MAU Note (Signed)
Pt reports bleeding since 0600 and uc's q 5 minutes

## 2012-08-31 NOTE — MAU Note (Signed)
Contractions started at 0600 this morning.  Bleeding "like a period" noted.  Hx of low lying placenta earlier in preg, had resolved.  Was 3 cm when checked.

## 2012-08-31 NOTE — Anesthesia Procedure Notes (Signed)

## 2012-08-31 NOTE — Anesthesia Postprocedure Evaluation (Signed)
Anesthesia Post Note  Patient: Yesenia Baker  Procedure(s) Performed: * No procedures listed *  Anesthesia type: Epidural  Patient location: Mother/Baby  Post pain: Pain level controlled  Post assessment: Post-op Vital signs reviewed  Last Vitals:  Filed Vitals:   08/31/12 1600  BP: 112/76  Pulse: 71  Temp: 36.9 C  Resp: 16    Post vital signs: Reviewed  Level of consciousness:alert  Complications: No apparent anesthesia complications

## 2012-08-31 NOTE — H&P (Signed)
Yesenia Baker is a 27 y.o. female presenting for labor. Maternal Medical History:  Reason for admission: Contractions.   Contractions: Onset was 3-5 hours ago.   Frequency: irregular.   Perceived severity is moderate.    Fetal activity: Perceived fetal activity is normal.   Last perceived fetal movement was within the past hour.      OB History   Grav Para Term Preterm Abortions TAB SAB Ect Mult Living   3 2 2       2      Past Medical History  Diagnosis Date  . Abnormal Pap smear     2007  . Asthma     albuterol prn  . Hx of varicella   . ZOXWRUEA(540.9)    Past Surgical History  Procedure Laterality Date  . Wisdom tooth extraction    . Cryotherapy  cervix-2007  . Leep  2007   Family History: family history includes Alzheimer's disease in her father; Cancer in her paternal grandmother; Diabetes in her maternal grandfather and maternal grandmother; and Hypertension in her maternal grandfather and maternal grandmother.  There is no history of Anesthesia problems, and Hypotension, and Malignant hyperthermia, and Pseudochol deficiency, . Social History:  reports that she has never smoked. She has never used smokeless tobacco. She reports that she does not drink alcohol or use illicit drugs.   Prenatal Transfer Tool  Maternal Diabetes: No Genetic Screening: Normal Maternal Ultrasounds/Referrals: Normal Fetal Ultrasounds or other Referrals:  None Maternal Substance Abuse:  No Significant Maternal Medications:  None Significant Maternal Lab Results:  None Other Comments:  None  ROS  Dilation: 4.5 Effacement (%): 80 Station: -2 Exam by:: B Mosca Blood pressure 122/79, temperature 98 F (36.7 C), temperature source Oral, resp. rate 18. Maternal Exam:  Uterine Assessment: Contraction strength is moderate.  Contraction frequency is irregular.   Abdomen: Patient reports no abdominal tenderness. Fundal height is term FH.   Estimated fetal weight is ~ 9 #  by [place] EFW.    Fetal presentation: vertex  Introitus: Normal vulva. Pelvis: adequate for delivery.   Cervix: Cervix evaluated by digital exam.     Physical Exam  Constitutional: She is oriented to person, place, and time. She appears well-developed and well-nourished.  HENT:  Head: Normocephalic and atraumatic.  Neck: Normal range of motion. Neck supple.  Cardiovascular: Normal rate and regular rhythm.   Respiratory: Effort normal and breath sounds normal.  GI:  Term FH, FHR 148  Genitourinary:  4/75/vtx, bloody show  Musculoskeletal: Normal range of motion.  Neurological: She is alert and oriented to person, place, and time.    Prenatal labs: ABO, Rh: B/Positive/-- (12/10 0000) Antibody: Negative (12/10 0000) Rubella: Immune (12/10 0000) RPR: Nonreactive (12/10 0000)  HBsAg: Negative (12/10 0000)  HIV: Non-reactive (12/10 0000)  GBS: Positive (06/30 0000)   Assessment/Plan: Term IUP, labor, + GBS   Prabhjot Maddux M 08/31/2012, 11:46 AM

## 2012-09-01 LAB — ABO/RH: ABO/RH(D): B POS

## 2012-09-01 LAB — CBC
MCV: 95 fL (ref 78.0–100.0)
Platelets: 194 10*3/uL (ref 150–400)
RBC: 2.99 MIL/uL — ABNORMAL LOW (ref 3.87–5.11)
WBC: 11.3 10*3/uL — ABNORMAL HIGH (ref 4.0–10.5)

## 2012-09-01 NOTE — Progress Notes (Signed)
Post Partum Day 1 Subjective: no complaints  Objective: Blood pressure 117/76, pulse 76, temperature 97.9 F (36.6 C), temperature source Oral, resp. rate 18, SpO2 99.00%, unknown if currently breastfeeding.  Physical Exam:  General: alert Lochia: appropriate Uterine Fundus: firm Incision: healing well DVT Evaluation: No evidence of DVT seen on physical exam.   Recent Labs  08/31/12 1110 09/01/12 0630  HGB 11.0* 9.6*  HCT 32.0* 28.4*    Assessment/Plan: Plan for discharge tomorrow   LOS: 1 day   Yesenia Baker M 09/01/2012, 9:11 AM

## 2012-09-02 MED ORDER — IBUPROFEN 800 MG PO TABS: 800.0000 mg | ORAL_TABLET | Freq: Three times a day (TID) | ORAL | Status: DC | PRN

## 2012-09-02 NOTE — Discharge Summary (Signed)
Obstetric Discharge Summary Reason for Admission: onset of labor Prenatal Procedures: ultrasound Intrapartum Procedures: spontaneous vaginal delivery Postpartum Procedures: none Complications-Operative and Postpartum: none Hemoglobin  Date Value Range Status  09/01/2012 9.6* 12.0 - 15.0 g/dL Final     HCT  Date Value Range Status  09/01/2012 28.4* 36.0 - 46.0 % Final    Physical Exam:  General: alert and cooperative Lochia: appropriate Uterine Fundus: firm Incision: perineum intact DVT Evaluation: No evidence of DVT seen on physical exam. Negative Homan's sign. No cords or calf tenderness. No significant calf/ankle edema.  Discharge Diagnoses: Term Pregnancy-delivered  Discharge Information: Date: 09/02/2012 Activity: pelvic rest Diet: routine Medications: PNV and Ibuprofen Condition: stable Instructions: refer to practice specific booklet Discharge to: home   Newborn Data: Live born female  Birth Weight: 9 lb 4 oz (4196 g) APGAR: 9, 9  Home with mother.  CURTIS,CAROL G 09/02/2012, 8:41 AM

## 2012-09-02 NOTE — Lactation Note (Signed)
This note was copied from the chart of Boy Jasani Lengel. Lactation Consultation Note  Patient Name: Boy Amery Vandenbos [id number] Date: 09/02/2012 Reason for consult: Follow-up assessment Per mom breast feeding is going well both breast . Denies soreness. Baby latched already , LC Noted a consistent pattern with multiply swallows .per mom comfortable  Reviewed prevention and tx of engorgement if needed. Instructed on pump set up and use. Mom aware of the [facility] and the [facility] O/P services.   Maternal Data    Feeding Feeding Type:  (baby already latched, consistent pattern with swallows ) Length of feed: 30 min  LATCH Score/Interventions Latch:  (latched with depth , )  Audible Swallowing:  (multiply swallows noted )  Type of Nipple:  (mom denies sore nipples )  Comfort (Breast/Nipple):  (per mom comfortable )     Hold (Positioning):  (mom independent with latch )  LATCH Score: 10  Lactation Tools Discussed/Used Tools: Pump Breast pump type: Double-Electric Breast Pump Pump Review: Setup, frequency, and cleaning Initiated by:: MAI  Date initiated:: 09/02/12   Consult Status Consult Status: Complete (aware of the [facility] and the [facility] O/P services )    Kathrin Greathouse 09/02/2012, 11:06 AM

## 2012-09-03 ENCOUNTER — Inpatient Hospital Stay (HOSPITAL_COMMUNITY): Admission: RE | Admit: 2012-09-03 | Payer: 59 | Source: Ambulatory Visit

## 2013-04-04 ENCOUNTER — Emergency Department (HOSPITAL_BASED_OUTPATIENT_CLINIC_OR_DEPARTMENT_OTHER)
Admission: EM | Admit: 2013-04-04 | Discharge: 2013-04-05 | Disposition: A | Discharge status: Home / Self Care | Payer: 59 | Attending: Emergency Medicine | Admitting: Emergency Medicine

## 2013-04-04 ENCOUNTER — Encounter (HOSPITAL_BASED_OUTPATIENT_CLINIC_OR_DEPARTMENT_OTHER): Payer: Self-pay | Admitting: Emergency Medicine

## 2013-04-04 ENCOUNTER — Emergency Department (HOSPITAL_BASED_OUTPATIENT_CLINIC_OR_DEPARTMENT_OTHER): Payer: 59

## 2013-04-04 DIAGNOSIS — S6991XA Unspecified injury of right wrist, hand and finger(s), initial encounter: Secondary | ICD-10-CM

## 2013-04-04 DIAGNOSIS — J45909 Unspecified asthma, uncomplicated: Secondary | ICD-10-CM | POA: Insufficient documentation

## 2013-04-04 DIAGNOSIS — S6990XA Unspecified injury of unspecified wrist, hand and finger(s), initial encounter: Principal | ICD-10-CM | POA: Insufficient documentation

## 2013-04-04 DIAGNOSIS — Z79899 Other long term (current) drug therapy: Secondary | ICD-10-CM | POA: Insufficient documentation

## 2013-04-04 DIAGNOSIS — S59909A Unspecified injury of unspecified elbow, initial encounter: Secondary | ICD-10-CM | POA: Insufficient documentation

## 2013-04-04 DIAGNOSIS — Z3202 Encounter for pregnancy test, result negative: Secondary | ICD-10-CM | POA: Insufficient documentation

## 2013-04-04 DIAGNOSIS — Y9323 Activity, snow (alpine) (downhill) skiing, snow boarding, sledding, tobogganing and snow tubing: Secondary | ICD-10-CM | POA: Insufficient documentation

## 2013-04-04 DIAGNOSIS — S59919A Unspecified injury of unspecified forearm, initial encounter: Principal | ICD-10-CM

## 2013-04-04 DIAGNOSIS — Z8619 Personal history of other infectious and parasitic diseases: Secondary | ICD-10-CM | POA: Insufficient documentation

## 2013-04-04 DIAGNOSIS — Y9289 Other specified places as the place of occurrence of the external cause: Secondary | ICD-10-CM | POA: Insufficient documentation

## 2013-04-04 LAB — PREGNANCY, URINE: PREG TEST UR: NEGATIVE

## 2013-04-04 NOTE — ED Provider Notes (Signed)
CSN: [id number]     Arrival date & time 04/04/13  2254 History  This chart was scribed for Landrum Carbonell Alfonso Patten, MD by Eston Mould, ED Scribe. This patient was seen in room MH07/MH07 and the patient's care was started at 11:46 PM.   Chief Complaint  Patient presents with  . Wrist Pain   Patient is a 28 y.o. female presenting with wrist pain. The history is provided by the patient. No language interpreter was used.  Wrist Pain This is a new problem. The current episode started 6 to 12 hours ago. The problem occurs constantly. The problem has not changed since onset.Pertinent negatives include no chest pain, no abdominal pain, no headaches and no shortness of breath. Nothing aggravates the symptoms. Nothing relieves the symptoms. Treatments tried: ibuprofen. The treatment provided no relief.   HPI Comments: Yesenia Baker is a 28 y.o. female who presents to the Emergency Department complaining of ongoing R wrist pain and swelling.. Pt states she fell with R hand stretched out on ice this morning while snowboarding. Pt states she has been taking OTC Ibuprofen without relief. Pt denies LOC and hitting head. Pt denies any other injuries.  Past Medical History  Diagnosis Date  . Abnormal Pap smear     2007  . Asthma     albuterol prn  . Hx of varicella   . PJASNKNL(976.7)    Past Surgical History  Procedure Laterality Date  . Wisdom tooth extraction    . Cryotherapy  cervix-2007  . Leep  2007   Family History  Problem Relation Age of Onset  . Anesthesia problems Neg Hx   . Hypotension Neg Hx   . Malignant hyperthermia Neg Hx   . Pseudochol deficiency Neg Hx   . Diabetes Maternal Grandmother   . Hypertension Maternal Grandmother   . Diabetes Maternal Grandfather   . Hypertension Maternal Grandfather   . Alzheimer's disease Father   . Cancer Paternal Grandmother     squamous cell   History  Substance Use Topics  . Smoking status: Never Smoker   . Smokeless  tobacco: Never Used  . Alcohol Use: No   OB History   Grav Para Term Preterm Abortions TAB SAB Ect Mult Living   3 3 3       3      Review of Systems  Respiratory: Negative for shortness of breath.   Cardiovascular: Negative for chest pain.  Gastrointestinal: Negative for abdominal pain.  Neurological: Negative for headaches.  All other systems reviewed and are negative.   Allergies  Ceclor  Home Medications   Current Outpatient Rx  Name  Route  Sig  Dispense  Refill  . acetaminophen (TYLENOL) 500 MG tablet   Oral   Take 1,000 mg by mouth every 6 (six) hours as needed for pain.         Marland Kitchen albuterol (PROVENTIL HFA;VENTOLIN HFA) 108 (90 BASE) MCG/ACT inhaler   Inhalation   Inhale 2 puffs into the lungs every 6 (six) hours as needed for wheezing.         Marland Kitchen ibuprofen (ADVIL,MOTRIN) 800 MG tablet   Oral   Take 1 tablet (800 mg total) by mouth every 8 (eight) hours as needed.   30 tablet   1   . Prenatal Vit-Fe Fumarate-FA (PRENATAL MULTIVITAMIN) TABS   Oral   Take 1 tablet by mouth daily at 12 noon.          BP 109/77  Pulse 86  Temp(Src)  97.7 F (36.5 C) (Oral)  Resp 20  Wt 125 lb (56.7 kg)  SpO2 100%  LMP 09/01/2012  Physical Exam  Constitutional: She is oriented to person, place, and time. She appears well-developed and well-nourished. No distress.  HENT:  Head: Normocephalic.  Mouth/Throat: Oropharynx is clear and moist.  Eyes: Pupils are equal, round, and reactive to light.  Neck: Normal range of motion. Neck supple.  Cardiovascular: Normal rate, regular rhythm and normal heart sounds.   Pulmonary/Chest: Effort normal and breath sounds normal. She has no wheezes.  Abdominal: Soft. There is no tenderness. There is no rebound and no guarding.  Musculoskeletal: Normal range of motion. She exhibits edema and tenderness.  Cap refill less than 2 second to all digits of the right hand Intact pronation and supination R forearm. 2+ dorsalis pedis swelling over  the dorsum of the right rights    Neurological: She is alert and oriented to person, place, and time. She has normal reflexes.  Skin: Skin is warm and dry.  Psychiatric: She has a normal mood and affect.    ED Course  Procedures  DIAGNOSTIC STUDIES: Oxygen Saturation is 100% on RA, normal by my interpretation.    COORDINATION OF CARE: 11:48 PM-Discussed treatment plan which includes discharge pt with thumb spica to R wrist. Pt declined pain medication due to nursing at this time. Pt agreed to plan.    Labs Review Labs Reviewed  PREGNANCY, URINE   Imaging Review No results found.   EKG Interpretation None     MDM   Final diagnoses:  None    Splinted will treat as scaphoid fracture given mechanism ans pain.  Follow up with hand surgery.  Patient verbalizes understanding and agrees to follow up  I personally performed the services described in this documentation, which was scribed in my presence. The recorded information has been reviewed and is accurate.     Yesenia Baker Alfonso Patten, MD 04/05/13 [phone]

## 2013-04-04 NOTE — ED Notes (Signed)
Right wrist pain. She fell on ice this am. Swelling noted.

## 2013-04-05 MED ORDER — OXYCODONE-ACETAMINOPHEN 5-325 MG PO TABS: 1.0000 | ORAL_TABLET | Freq: Four times a day (QID) | ORAL | Status: DC | PRN

## 2013-04-05 MED ORDER — IBUPROFEN 800 MG PO TABS: 800.0000 mg | ORAL_TABLET | Freq: Three times a day (TID) | ORAL | Status: DC

## 2013-04-05 NOTE — ED Notes (Signed)
I applied a thumb spica splint using fiberglass material. I first applied sock material, then cotton webril and secured fiberglass with ace bandage. Capillary refill before and after splint were under 2 seconds.  Patient declined a sling for support.

## 2013-05-20 ENCOUNTER — Ambulatory Visit: Payer: 59 | Attending: Sports Medicine | Admitting: *Deleted

## 2013-05-20 DIAGNOSIS — R5381 Other malaise: Secondary | ICD-10-CM | POA: Insufficient documentation

## 2013-05-20 DIAGNOSIS — M6281 Muscle weakness (generalized): Secondary | ICD-10-CM | POA: Insufficient documentation

## 2013-05-20 DIAGNOSIS — M2560 Stiffness of unspecified joint, not elsewhere classified: Secondary | ICD-10-CM | POA: Insufficient documentation

## 2013-05-20 DIAGNOSIS — IMO0001 Reserved for inherently not codable concepts without codable children: Secondary | ICD-10-CM | POA: Insufficient documentation

## 2013-05-22 ENCOUNTER — Ambulatory Visit: Payer: 59 | Admitting: *Deleted

## 2013-05-30 ENCOUNTER — Encounter: Payer: Self-pay | Admitting: *Deleted

## 2013-06-05 ENCOUNTER — Encounter: Payer: Self-pay | Admitting: *Deleted

## 2013-06-10 ENCOUNTER — Encounter: Payer: Self-pay | Admitting: Occupational Therapy

## 2013-12-08 ENCOUNTER — Encounter (HOSPITAL_BASED_OUTPATIENT_CLINIC_OR_DEPARTMENT_OTHER): Payer: Self-pay | Admitting: Emergency Medicine

## 2014-03-05 ENCOUNTER — Ambulatory Visit (HOSPITAL_COMMUNITY)
Admission: RE | Admit: 2014-03-05 | Discharge: 2014-03-05 | Disposition: A | Discharge status: Home / Self Care | Payer: 59 | Source: Ambulatory Visit | Attending: Family Medicine | Admitting: Family Medicine

## 2014-03-05 ENCOUNTER — Other Ambulatory Visit (HOSPITAL_COMMUNITY): Payer: Self-pay | Admitting: Family Medicine

## 2014-03-05 ENCOUNTER — Encounter (INDEPENDENT_AMBULATORY_CARE_PROVIDER_SITE_OTHER): Payer: Self-pay

## 2014-03-05 DIAGNOSIS — R0989 Other specified symptoms and signs involving the circulatory and respiratory systems: Secondary | ICD-10-CM | POA: Insufficient documentation

## 2014-03-05 DIAGNOSIS — R05 Cough: Secondary | ICD-10-CM | POA: Diagnosis present

## 2014-03-05 DIAGNOSIS — R079 Chest pain, unspecified: Secondary | ICD-10-CM | POA: Insufficient documentation

## 2014-03-05 DIAGNOSIS — R059 Cough, unspecified: Secondary | ICD-10-CM

## 2014-12-11 LAB — OB RESULTS CONSOLE RPR: RPR: NONREACTIVE

## 2014-12-11 LAB — OB RESULTS CONSOLE GBS
STREP GROUP B AG: POSITIVE
STREP GROUP B AG: POSITIVE

## 2014-12-11 LAB — OB RESULTS CONSOLE HIV ANTIBODY (ROUTINE TESTING): HIV: NONREACTIVE

## 2014-12-21 LAB — OB RESULTS CONSOLE RUBELLA ANTIBODY, IGM: Rubella: IMMUNE

## 2014-12-21 LAB — OB RESULTS CONSOLE HEPATITIS B SURFACE ANTIGEN: Hepatitis B Surface Ag: NEGATIVE

## 2014-12-25 LAB — OB RESULTS CONSOLE GC/CHLAMYDIA
CHLAMYDIA, DNA PROBE: NEGATIVE
GC PROBE AMP, GENITAL: NEGATIVE

## 2015-02-16 DIAGNOSIS — Z36 Encounter for antenatal screening of mother: Secondary | ICD-10-CM | POA: Diagnosis not present

## 2015-02-16 DIAGNOSIS — Z3A18 18 weeks gestation of pregnancy: Secondary | ICD-10-CM | POA: Diagnosis not present

## 2015-02-16 DIAGNOSIS — Z3400 Encounter for supervision of normal first pregnancy, unspecified trimester: Secondary | ICD-10-CM | POA: Diagnosis not present

## 2015-04-13 DIAGNOSIS — Z3400 Encounter for supervision of normal first pregnancy, unspecified trimester: Secondary | ICD-10-CM | POA: Diagnosis not present

## 2015-04-13 DIAGNOSIS — Z36 Encounter for antenatal screening of mother: Secondary | ICD-10-CM | POA: Diagnosis not present

## 2015-04-13 DIAGNOSIS — Z23 Encounter for immunization: Secondary | ICD-10-CM | POA: Diagnosis not present

## 2015-04-13 DIAGNOSIS — D509 Iron deficiency anemia, unspecified: Secondary | ICD-10-CM | POA: Diagnosis not present

## 2015-04-27 DIAGNOSIS — Z3A28 28 weeks gestation of pregnancy: Secondary | ICD-10-CM | POA: Diagnosis not present

## 2015-04-27 DIAGNOSIS — O4403 Placenta previa specified as without hemorrhage, third trimester: Secondary | ICD-10-CM | POA: Diagnosis not present

## 2015-04-27 DIAGNOSIS — Z3400 Encounter for supervision of normal first pregnancy, unspecified trimester: Secondary | ICD-10-CM | POA: Diagnosis not present

## 2015-05-27 DIAGNOSIS — Z3493 Encounter for supervision of normal pregnancy, unspecified, third trimester: Secondary | ICD-10-CM | POA: Diagnosis not present

## 2015-05-27 DIAGNOSIS — Z36 Encounter for antenatal screening of mother: Secondary | ICD-10-CM | POA: Diagnosis not present

## 2015-06-23 DIAGNOSIS — Z3A36 36 weeks gestation of pregnancy: Secondary | ICD-10-CM | POA: Diagnosis not present

## 2015-06-23 DIAGNOSIS — Z3493 Encounter for supervision of normal pregnancy, unspecified, third trimester: Secondary | ICD-10-CM | POA: Diagnosis not present

## 2015-06-23 DIAGNOSIS — O3663X0 Maternal care for excessive fetal growth, third trimester, not applicable or unspecified: Secondary | ICD-10-CM | POA: Diagnosis not present

## 2015-07-17 ENCOUNTER — Inpatient Hospital Stay (HOSPITAL_COMMUNITY): Payer: 59 | Admitting: Anesthesiology

## 2015-07-17 ENCOUNTER — Encounter (HOSPITAL_COMMUNITY): Payer: Self-pay | Admitting: *Deleted

## 2015-07-17 ENCOUNTER — Inpatient Hospital Stay (HOSPITAL_COMMUNITY)
Admission: AD | Admit: 2015-07-17 | Discharge: 2015-07-19 | DRG: 775 | Disposition: A | Discharge status: Home / Self Care | Payer: 59 | Source: Ambulatory Visit | Attending: Obstetrics and Gynecology | Admitting: Obstetrics and Gynecology

## 2015-07-17 DIAGNOSIS — Z23 Encounter for immunization: Secondary | ICD-10-CM

## 2015-07-17 DIAGNOSIS — IMO0001 Reserved for inherently not codable concepts without codable children: Secondary | ICD-10-CM

## 2015-07-17 DIAGNOSIS — Z3A40 40 weeks gestation of pregnancy: Secondary | ICD-10-CM

## 2015-07-17 HISTORY — DX: Unspecified abnormal cytological findings in specimens from vagina: R87.629

## 2015-07-17 HISTORY — DX: Unspecified infectious disease: B99.9

## 2015-07-17 LAB — CBC
HEMATOCRIT: 33.1 % — AB (ref 36.0–46.0)
HEMOGLOBIN: 11.4 g/dL — AB (ref 12.0–15.0)
MCH: 32.6 pg (ref 26.0–34.0)
MCHC: 34.4 g/dL (ref 30.0–36.0)
MCV: 94.6 fL (ref 78.0–100.0)
Platelets: 199 10*3/uL (ref 150–400)
RBC: 3.5 MIL/uL — ABNORMAL LOW (ref 3.87–5.11)
RDW: 13.5 % (ref 11.5–15.5)
WBC: 10.6 10*3/uL — ABNORMAL HIGH (ref 4.0–10.5)

## 2015-07-17 LAB — TYPE AND SCREEN
ABO/RH(D): B POS
Antibody Screen: NEGATIVE

## 2015-07-17 MED ORDER — OXYTOCIN BOLUS FROM INFUSION
500.0000 mL | INTRAVENOUS | Status: DC
  Administered 2015-07-17: 500 mL via INTRAVENOUS

## 2015-07-17 MED ORDER — ACETAMINOPHEN 325 MG PO TABS: 650.0000 mg | ORAL_TABLET | ORAL | Status: DC | PRN

## 2015-07-17 MED ORDER — ONDANSETRON HCL 4 MG/2ML IJ SOLN: 4.0000 mg | Freq: Four times a day (QID) | INTRAMUSCULAR | Status: DC | PRN

## 2015-07-17 MED ORDER — OXYTOCIN 40 UNITS IN LACTATED RINGERS INFUSION - SIMPLE MED
2.5000 [IU]/h | INTRAVENOUS | Status: DC
  Administered 2015-07-17: 2.5 [IU]/h via INTRAVENOUS
  Filled 2015-07-17: qty 1000

## 2015-07-17 MED ORDER — LIDOCAINE HCL (PF) 1 % IJ SOLN
30.0000 mL | INTRAMUSCULAR | Status: DC | PRN
  Filled 2015-07-17: qty 30

## 2015-07-17 MED ORDER — OXYCODONE-ACETAMINOPHEN 5-325 MG PO TABS
1.0000 | ORAL_TABLET | ORAL | Status: DC | PRN
Start: 2015-07-17 — End: 2015-07-19

## 2015-07-17 MED ORDER — DIPHENHYDRAMINE HCL 50 MG/ML IJ SOLN: 12.5000 mg | INTRAMUSCULAR | Status: DC | PRN

## 2015-07-17 MED ORDER — SOD CITRATE-CITRIC ACID 500-334 MG/5ML PO SOLN: 30.0000 mL | ORAL | Status: DC | PRN

## 2015-07-17 MED ORDER — LIDOCAINE HCL (PF) 1 % IJ SOLN
INTRAMUSCULAR | Status: DC | PRN
  Administered 2015-07-17: 5 mL via EPIDURAL
  Administered 2015-07-17: 6 mL via EPIDURAL

## 2015-07-17 MED ORDER — EPHEDRINE 5 MG/ML INJ
10.0000 mg | INTRAVENOUS | Status: DC | PRN
  Filled 2015-07-17: qty 2

## 2015-07-17 MED ORDER — CLINDAMYCIN PHOSPHATE 900 MG/50ML IV SOLN: 900.0000 mg | Freq: Once | INTRAVENOUS | Status: DC

## 2015-07-17 MED ORDER — LACTATED RINGERS IV SOLN
INTRAVENOUS | Status: DC
  Administered 2015-07-17: 19:00:00 via INTRAVENOUS

## 2015-07-17 MED ORDER — FLEET ENEMA 7-19 GM/118ML RE ENEM: 1.0000 | ENEMA | RECTAL | Status: DC | PRN

## 2015-07-17 MED ORDER — PHENYLEPHRINE 40 MCG/ML (10ML) SYRINGE FOR IV PUSH (FOR BLOOD PRESSURE SUPPORT)
80.0000 ug | PREFILLED_SYRINGE | INTRAVENOUS | Status: DC | PRN
  Filled 2015-07-17: qty 5

## 2015-07-17 MED ORDER — SODIUM CHLORIDE 0.9 % IV SOLN
2.0000 g | Freq: Four times a day (QID) | INTRAVENOUS | Status: DC
  Administered 2015-07-17: 2 g via INTRAVENOUS
  Filled 2015-07-17 (×4): qty 2000

## 2015-07-17 MED ORDER — PHENYLEPHRINE 40 MCG/ML (10ML) SYRINGE FOR IV PUSH (FOR BLOOD PRESSURE SUPPORT)
80.0000 ug | PREFILLED_SYRINGE | INTRAVENOUS | Status: DC | PRN
  Filled 2015-07-17: qty 5
  Filled 2015-07-17: qty 10

## 2015-07-17 MED ORDER — FENTANYL 2.5 MCG/ML BUPIVACAINE 1/10 % EPIDURAL INFUSION (WH - ANES)
14.0000 mL/h | INTRAMUSCULAR | Status: DC | PRN
  Administered 2015-07-17: 14 mL/h via EPIDURAL
  Filled 2015-07-17: qty 125

## 2015-07-17 MED ORDER — ALBUTEROL SULFATE (2.5 MG/3ML) 0.083% IN NEBU: 3.0000 mL | INHALATION_SOLUTION | Freq: Four times a day (QID) | RESPIRATORY_TRACT | Status: DC | PRN

## 2015-07-17 MED ORDER — OXYCODONE-ACETAMINOPHEN 5-325 MG PO TABS: 2.0000 | ORAL_TABLET | ORAL | Status: DC | PRN

## 2015-07-17 MED ORDER — LACTATED RINGERS IV SOLN
500.0000 mL | Freq: Once | INTRAVENOUS | Status: AC
  Administered 2015-07-17: 500 mL via INTRAVENOUS

## 2015-07-17 MED ORDER — LACTATED RINGERS IV SOLN: 500.0000 mL | INTRAVENOUS | Status: DC | PRN

## 2015-07-17 NOTE — H&P (Signed)
Yesenia Baker is a 30 y.o. female presenting for UCs. No ROM, no HA, no vision change, no epigastric pain. Maternal Medical History:  Reason for admission: Contractions.   Contractions: Onset was 3-5 hours ago.    Fetal activity: Perceived fetal activity is normal.      OB History    Gravida Para Term Preterm AB TAB SAB Ectopic Multiple Living   4 3 3       3      Past Medical History  Diagnosis Date  . Abnormal Pap smear     2007  . Asthma     albuterol prn  . Hx of varicella   . Headache(784.0)   . Infection     UTI  . Vaginal Pap smear, abnormal    Past Surgical History  Procedure Laterality Date  . Wisdom tooth extraction    . Cryotherapy  cervix-2007  . Leep  2007   Family History: family history includes Alzheimer's disease in her father; Asthma in her mother; Cancer in her paternal grandmother; Diabetes in her maternal grandfather and maternal grandmother; Hypertension in her maternal grandfather and maternal grandmother; Hypothyroidism in her mother. There is no history of Anesthesia problems, Hypotension, Malignant hyperthermia, or Pseudochol deficiency. Social History:  reports that she has never smoked. She has never used smokeless tobacco. She reports that she does not drink alcohol or use illicit drugs.   Prenatal Transfer Tool  Maternal Diabetes: No Genetic Screening: Normal Maternal Ultrasounds/Referrals: Normal Fetal Ultrasounds or other Referrals:  None Maternal Substance Abuse:  No Significant Maternal Medications:  None Significant Maternal Lab Results:  None Other Comments:  None  Review of Systems  Eyes: Negative for blurred vision.  Gastrointestinal: Negative for abdominal pain.  Neurological: Negative for headaches.    Dilation: 5 Effacement (%): 70 Station: -2 Exam by:: jolynn Blood pressure 123/74, pulse 68, temperature 97.6 F (36.4 C), temperature source Oral, resp. rate 16, height 5\' 8"  (1.727 m), weight 182 lb (82.555 kg),  unknown if currently breastfeeding. Maternal Exam:  Uterine Assessment: Contraction strength is moderate.  Contraction frequency is regular.   Abdomen: Patient reports no abdominal tenderness. Fetal presentation: vertex     Fetal Exam Fetal State Assessment: Category I - tracings are normal.     Physical Exam  Cardiovascular: Normal rate.   Respiratory: Effort normal.  GI: Soft.  Neurological: She has normal reflexes.    Cx 5/C/-2/vtx AROM clear  Prenatal labs: ABO, Rh: --/--/B POS (06/10 1844) Antibody: NEG (06/10 1844) Rubella: Immune (11/14 0000) RPR: Nonreactive (11/04 0000)  HBsAg: Negative (11/14 0000)  HIV: Non-reactive (11/04 0000)  GBS: Positive, Positive (11/04 0000)   Assessment/Plan: 29 yo G4P3 at 40 0/7 weeks in active labor ATB ordered for GBBS prophylaxis Anticipate vaginal delivery   Yesenia Baker II,Yesenia Baker E 07/17/2015, 9:24 PM

## 2015-07-17 NOTE — Anesthesia Procedure Notes (Signed)
Epidural Patient location during procedure: OB Start time: 07/17/2015 9:54 PM End time: 07/17/2015 9:58 PM  Staffing Anesthesiologist: Lyn Hollingshead Performed by: anesthesiologist   Preanesthetic Checklist Completed: patient identified, surgical consent, pre-op evaluation, timeout performed, IV checked, risks and benefits discussed and monitors and equipment checked  Epidural Patient position: sitting Prep: site prepped and draped and DuraPrep Patient monitoring: continuous pulse ox and blood pressure Approach: midline Location: L3-L4 Injection technique: LOR air  Needle:  Needle type: Tuohy  Needle gauge: 17 G Needle length: 9 cm and 9 Needle insertion depth: 5 cm cm Catheter type: closed end flexible Catheter size: 19 Gauge Catheter at skin depth: 10 cm Test dose: negative and Other  Assessment Sensory level: T10 Events: blood not aspirated, injection not painful, no injection resistance, negative IV test and no paresthesia  Additional Notes Reason for block:procedure for pain

## 2015-07-17 NOTE — MAU Note (Signed)
Contractions, regular since lunch.  Small amt of bloody show.  4th baby. Was 2 when last checked.

## 2015-07-17 NOTE — Anesthesia Pain Management Evaluation Note (Signed)
  CRNA Pain Management Visit Note  Patient: Yesenia Baker, 30 y.o., female  "Hello I am a member of the anesthesia team at [facility]. We have an anesthesia team available at all times to provide care throughout the hospital, including epidural management and anesthesia for C-section. I don't know your plan for the delivery whether it a natural birth, water birth, IV sedation, nitrous supplementation, doula or epidural, but we want to meet your pain goals."   1.Was your pain managed to your expectations on prior hospitalizations?   Yes   2.What is your expectation for pain management during this hospitalization?     Epidural  3.How can we help you reach that goal?   Record the patient's initial score and the patient's pain goal.   Pain: 5  Pain Goal: 8 [facility] wants you to be able to say your pain was always managed very well.  [name] 07/17/2015

## 2015-07-17 NOTE — Anesthesia Preprocedure Evaluation (Signed)
Anesthesia Evaluation  Patient identified by MRN, date of birth, ID band Patient awake    Reviewed: Allergy & Precautions, H&P , NPO status , Patient's Chart, lab work & pertinent test results  Airway Mallampati: I  TM Distance: >3 FB Neck ROM: full    Dental no notable dental hx.    Pulmonary    Pulmonary exam normal        Cardiovascular negative cardio ROS Normal cardiovascular exam     Neuro/Psych negative psych ROS   GI/Hepatic negative GI ROS, Neg liver ROS,   Endo/Other  negative endocrine ROS  Renal/GU negative Renal ROS     Musculoskeletal   Abdominal Normal abdominal exam  (+)   Peds  Hematology negative hematology ROS (+)   Anesthesia Other Findings   Reproductive/Obstetrics (+) Pregnancy                             Anesthesia Physical Anesthesia Plan  ASA: II  Anesthesia Plan: Epidural   Post-op Pain Management:    Induction:   Airway Management Planned:   Additional Equipment:   Intra-op Plan:   Post-operative Plan:   Informed Consent: I have reviewed the patients History and Physical, chart, labs and discussed the procedure including the risks, benefits and alternatives for the proposed anesthesia with the patient or authorized representative who has indicated his/her understanding and acceptance.     Plan Discussed with:   Anesthesia Plan Comments:         Anesthesia Quick Evaluation

## 2015-07-17 NOTE — Progress Notes (Signed)
Delivery Note At 10:28 PM a viable female was delivered via  (Presentation: ;  ).  APGAR: , ; weight  .   Placenta status: , .  Cord:  with the following complications: .  Cord pH: not done  Anesthesia:   Episiotomy:  none Lacerations:  none Suture Repair: N/A Est. Blood Loss (mL):  250  Mom to postpartum.  Baby to Couplet care / Skin to Skin.  Ziasia Lenoir II,Serenity Batley E 07/17/2015, 10:35 PM

## 2015-07-18 ENCOUNTER — Encounter (HOSPITAL_COMMUNITY): Payer: Self-pay | Admitting: *Deleted

## 2015-07-18 LAB — CBC
HEMATOCRIT: 33.4 % — AB (ref 36.0–46.0)
HEMOGLOBIN: 11.5 g/dL — AB (ref 12.0–15.0)
MCH: 32.7 pg (ref 26.0–34.0)
MCHC: 34.4 g/dL (ref 30.0–36.0)
MCV: 94.9 fL (ref 78.0–100.0)
Platelets: 203 10*3/uL (ref 150–400)
RBC: 3.52 MIL/uL — AB (ref 3.87–5.11)
RDW: 13.6 % (ref 11.5–15.5)
WBC: 14.7 10*3/uL — ABNORMAL HIGH (ref 4.0–10.5)

## 2015-07-18 LAB — RPR: RPR Ser Ql: NONREACTIVE

## 2015-07-18 MED ORDER — COCONUT OIL OIL: 1.0000 "application " | TOPICAL_OIL | Status: DC | PRN

## 2015-07-18 MED ORDER — DIBUCAINE 1 % RE OINT
1.0000 "application " | TOPICAL_OINTMENT | RECTAL | Status: DC | PRN
  Administered 2015-07-18: 1 via RECTAL
  Filled 2015-07-18: qty 28

## 2015-07-18 MED ORDER — WITCH HAZEL-GLYCERIN EX PADS: 1.0000 "application " | MEDICATED_PAD | CUTANEOUS | Status: DC | PRN

## 2015-07-18 MED ORDER — ZOLPIDEM TARTRATE 5 MG PO TABS: 5.0000 mg | ORAL_TABLET | Freq: Every evening | ORAL | Status: DC | PRN

## 2015-07-18 MED ORDER — ACETAMINOPHEN 325 MG PO TABS: 650.0000 mg | ORAL_TABLET | ORAL | Status: DC | PRN

## 2015-07-18 MED ORDER — DIPHENHYDRAMINE HCL 25 MG PO CAPS: 25.0000 mg | ORAL_CAPSULE | Freq: Four times a day (QID) | ORAL | Status: DC | PRN

## 2015-07-18 MED ORDER — BENZOCAINE-MENTHOL 20-0.5 % EX AERO
1.0000 | INHALATION_SPRAY | CUTANEOUS | Status: DC | PRN
Start: 2015-07-18 — End: 2015-07-19
  Administered 2015-07-18: 1 via TOPICAL
  Filled 2015-07-18: qty 56

## 2015-07-18 MED ORDER — SENNOSIDES-DOCUSATE SODIUM 8.6-50 MG PO TABS
2.0000 | ORAL_TABLET | ORAL | Status: DC
  Administered 2015-07-18: 2 via ORAL
  Filled 2015-07-18: qty 2

## 2015-07-18 MED ORDER — TETANUS-DIPHTH-ACELL PERTUSSIS 5-2.5-18.5 LF-MCG/0.5 IM SUSP: 0.5000 mL | Freq: Once | INTRAMUSCULAR | Status: DC

## 2015-07-18 MED ORDER — ONDANSETRON HCL 4 MG/2ML IJ SOLN: 4.0000 mg | INTRAMUSCULAR | Status: DC | PRN

## 2015-07-18 MED ORDER — IBUPROFEN 600 MG PO TABS
600.0000 mg | ORAL_TABLET | Freq: Four times a day (QID) | ORAL | Status: DC
  Administered 2015-07-18 – 2015-07-19 (×6): 600 mg via ORAL
  Filled 2015-07-18 (×6): qty 1

## 2015-07-18 MED ORDER — SIMETHICONE 80 MG PO CHEW: 80.0000 mg | CHEWABLE_TABLET | ORAL | Status: DC | PRN

## 2015-07-18 MED ORDER — PRENATAL MULTIVITAMIN CH
1.0000 | ORAL_TABLET | Freq: Every day | ORAL | Status: DC
  Administered 2015-07-18 – 2015-07-19 (×2): 1 via ORAL
  Filled 2015-07-18 (×2): qty 1

## 2015-07-18 MED ORDER — ONDANSETRON HCL 4 MG PO TABS: 4.0000 mg | ORAL_TABLET | ORAL | Status: DC | PRN

## 2015-07-18 NOTE — Progress Notes (Signed)
Post Partum Day 1 Subjective: no complaints, up ad lib, voiding, tolerating PO and + flatus  Objective: Blood pressure 103/64, pulse 55, temperature 97.9 F (36.6 C), temperature source Oral, resp. rate 16, height 5\' 8"  (1.727 m), weight 182 lb (82.555 kg), SpO2 99 %, unknown if currently breastfeeding.  Physical Exam:  General: alert, cooperative and no distress Lochia: appropriate Uterine Fundus: firm Incision: healing well DVT Evaluation: No evidence of DVT seen on physical exam.   Recent Labs  07/17/15 1844 07/18/15 0516  HGB 11.4* 11.5*  HCT 33.1* 33.4*    Assessment/Plan: Plan for discharge tomorrow   LOS: 1 day   Briella Hobday II,Shequita Peplinski E 07/18/2015, 8:05 AM

## 2015-07-18 NOTE — Anesthesia Postprocedure Evaluation (Signed)
Anesthesia Post Note  Patient: MYLINH BILTON  Procedure(s) Performed: * No procedures listed *  Patient location during evaluation: Mother Baby Anesthesia Type: Epidural Level of consciousness: awake and alert Pain management: satisfactory to patient Vital Signs Assessment: post-procedure vital signs reviewed and stable Respiratory status: respiratory function stable Cardiovascular status: stable Postop Assessment: no headache, no backache, epidural receding, patient able to bend at knees, no signs of nausea or vomiting and adequate PO intake Anesthetic complications: no Comments: Comfort level was assessed by AnesthesiaTeam and the patient was pleased with the care, interventions, and services provided by the Department of Anesthesia.     Last Vitals:  Filed Vitals:   07/18/15 0110 07/18/15 0500  BP: 101/48 103/64  Pulse: 67 55  Temp: 36.6 C 36.6 C  Resp: 18 16    Last Pain:  Filed Vitals:   07/18/15 0633  PainSc: 0-No pain   Pain Goal: Patients Stated Pain Goal: 4 (07/17/15 1934)               Katherina Mires

## 2015-07-18 NOTE — Lactation Note (Signed)
This note was copied from a baby's chart. Lactation Consultation Note  Patient Name: Yesenia Baker [id number] Date: 07/18/2015 Reason for consult: Initial assessment   With this mom and term baby. This is mom's fourth baby, and denies any breastfeeding concerns or questions at this time. The baby is 71 hours old, and has more than adequate wet and dirty diapers .  Brief review of basic breastfeeding teaching dione from the Baby and Me book.  Of note, mom speaks and holds her tongue as if she herself has a tongue tie. I did not say anything to mom about this. Mom denies andy nipple pain for discomfort with breastfeeding. Mom feels the baby is already cluster feeding.    Maternal Data Formula Feeding for Exclusion: No Has patient been taught Hand Expression?: No (mom experienced breast feeder) Does the patient have breastfeeding experience prior to this delivery?: Yes  Feeding Feeding Type: Breast Fed Length of feed: 20 min  LATCH Score/Interventions                      Lactation Tools Discussed/Used     Consult Status Consult Status: Follow-up Date: 07/19/15 Follow-up type: In-patient    Tonna Corner 07/18/2015, 2:02 PM

## 2015-07-19 NOTE — Discharge Summary (Signed)
Obstetric Discharge Summary Reason for Admission: onset of labor Prenatal Procedures: ultrasound Intrapartum Procedures: spontaneous vaginal delivery Postpartum Procedures: none Complications-Operative and Postpartum: none HEMOGLOBIN  Date Value Ref Range Status  07/18/2015 11.5* 12.0 - 15.0 g/dL Final   HCT  Date Value Ref Range Status  07/18/2015 33.4* 36.0 - 46.0 % Final    Physical Exam:  General: alert and cooperative Lochia: appropriate Uterine Fundus: firm Incision: perineum intact DVT Evaluation: No evidence of DVT seen on physical exam. Negative Homan's sign. No cords or calf tenderness. No significant calf/ankle edema.  Discharge Diagnoses: Term Pregnancy-delivered  Discharge Information: Date: 07/19/2015 Activity: pelvic rest Diet: routine Medications: PNV and Ibuprofen Condition: stable Instructions: refer to practice specific booklet Discharge to: home   Newborn Data: Live born female  Birth Weight: 8 lb 11.2 oz (3945 g) APGAR: 9, 9  Home with mother and desires circ prior to discharge.  CURTIS,CAROL G 07/19/2015, 8:35 AM   Agree with above. Patient is counseled for circ including risk of bleeding, infection and scarring.  All questions were answered and the patient wishes to proceed.  Linda Hedges, DO

## 2015-07-19 NOTE — Lactation Note (Signed)
This note was copied from a baby's chart. Lactation Consultation Note  Experienced BF mother declines need for assistance at this time.  Patient Name: Yesenia Baker [id number] Date: 07/19/2015 Reason for consult: Follow-up assessment   Maternal Data    Feeding    [facility] Score/Interventions                      Lactation Tools Discussed/Used     Consult Status      [name] 07/19/2015, 11:23 AM

## 2015-08-24 DIAGNOSIS — Z01419 Encounter for gynecological examination (general) (routine) without abnormal findings: Secondary | ICD-10-CM | POA: Diagnosis not present

## 2015-08-24 DIAGNOSIS — Z1389 Encounter for screening for other disorder: Secondary | ICD-10-CM | POA: Diagnosis not present

## 2015-12-06 DIAGNOSIS — H5202 Hypermetropia, left eye: Secondary | ICD-10-CM | POA: Diagnosis not present

## 2015-12-06 DIAGNOSIS — H52223 Regular astigmatism, bilateral: Secondary | ICD-10-CM | POA: Diagnosis not present

## 2016-01-03 MED FILL — NITROFURANTOIN MONO-MCR 100: 100 | 7 days supply | Qty: 14 | Fill #0

## 2016-02-20 IMAGING — DX DG CHEST 2V
2 series · 2 of 2 positions shown · non-contrast
Comparison: PA and lateral chest x-ray September 20, 2003

CLINICAL DATA: Productive cough and congestion and right lower
chest pain for the past 10 days; currently being treated for
pneumonia; history of asthma.

EXAM:
CHEST  2 VIEW

[chest pa]
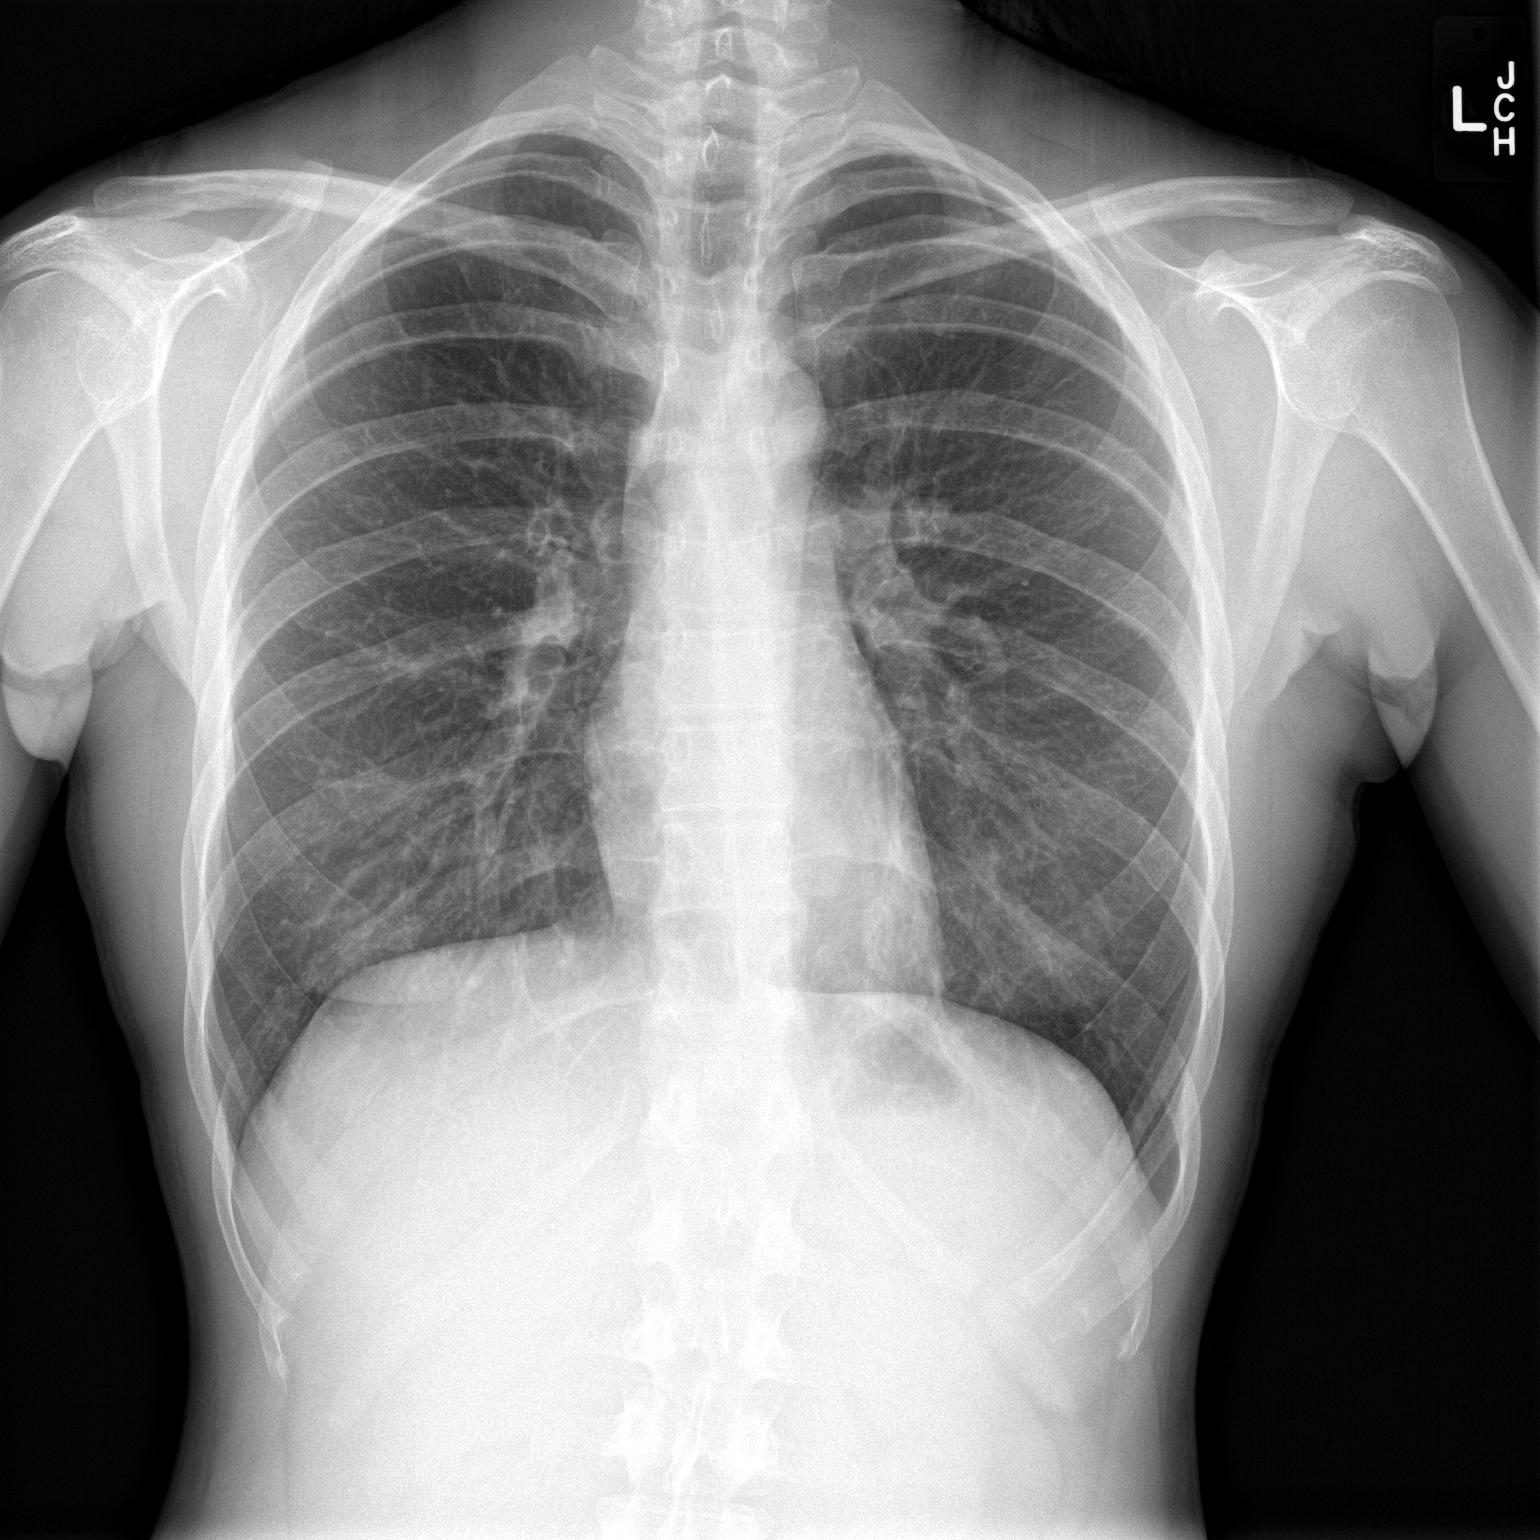

[chest lat]
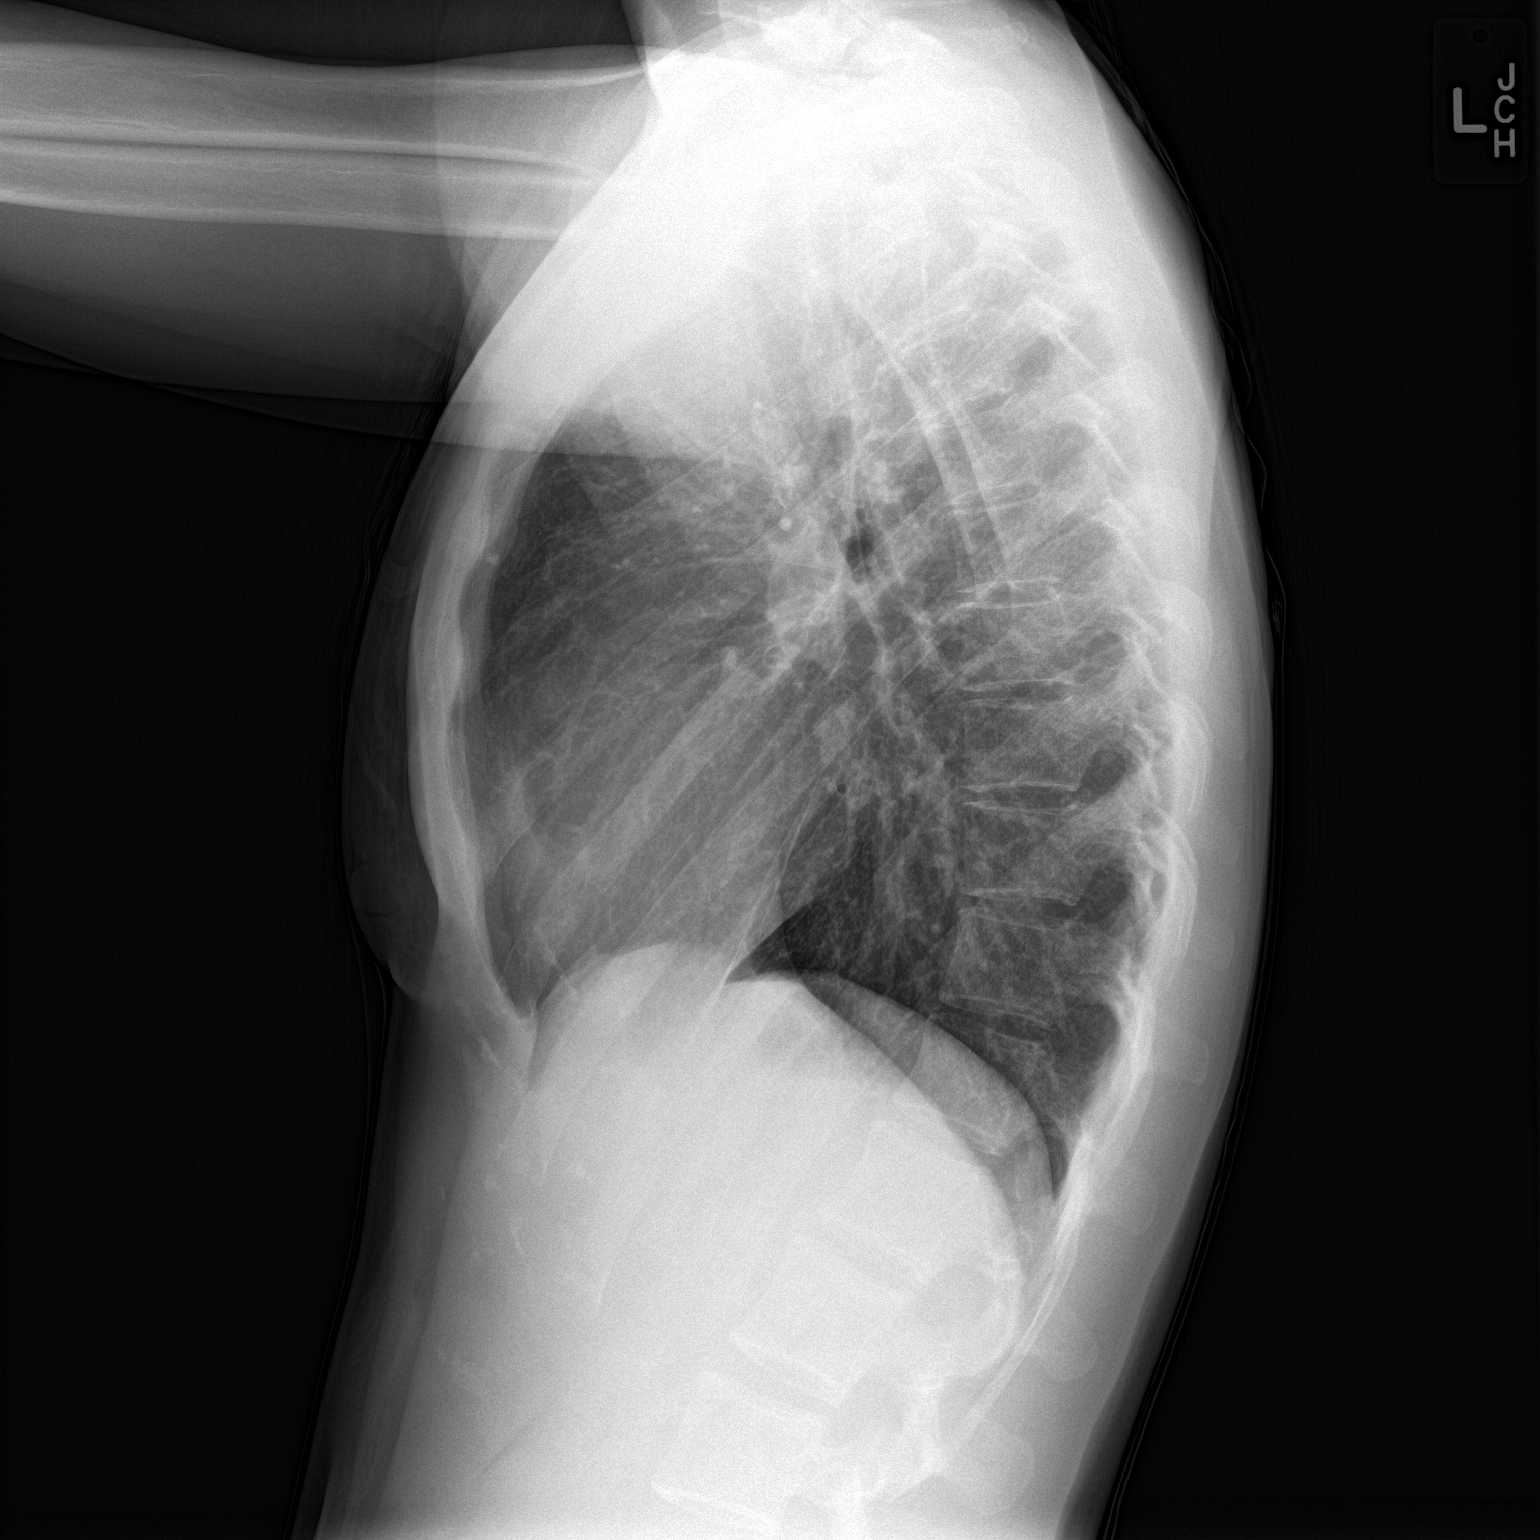

[2 of 2 positions shown; findings below may reference images not displayed]

FINDINGS: The lungs are mildly hyperinflated. There is no focal infiltrate.
The heart and pulmonary vascularity are normal. The mediastinum is
normal in width. The trachea is midline. There is no pleural
effusion or pneumothorax. The bony thorax is unremarkable.
IMPRESSION: Hyperinflation consistent with reactive airway disease. There is no
evidence of pneumonia nor other active cardiopulmonary disease.

## 2016-04-28 ENCOUNTER — Telehealth: Payer: 59 | Admitting: Family

## 2016-04-28 DIAGNOSIS — J329 Chronic sinusitis, unspecified: Secondary | ICD-10-CM | POA: Diagnosis not present

## 2016-04-28 DIAGNOSIS — B9689 Other specified bacterial agents as the cause of diseases classified elsewhere: Secondary | ICD-10-CM | POA: Diagnosis not present

## 2016-04-28 MED ORDER — DOXYCYCLINE HYCLATE 100 MG PO TABS: 100.0000 mg | ORAL_TABLET | Freq: Two times a day (BID) | ORAL | 0 refills | Status: DC

## 2016-04-28 NOTE — Progress Notes (Signed)
We are sorry that you are not feeling well.  Here is how we plan to help!  Based on what you have shared with me it looks like you have sinusitis.  Sinusitis is inflammation and infection in the sinus cavities of the head.  Based on your presentation I believe you most likely have Acute Bacterial Sinusitis.  This is an infection caused by bacteria and is treated with antibiotics. I have prescribed Doxycycline 100mg  by mouth twice a day for 7 days (studies show treatments up to 3 weeks are safe with this and 7-10 days is ideal with your condition). You may use an oral decongestant such as Mucinex D or if you have glaucoma or high blood pressure use plain Mucinex. Saline nasal spray help and can safely be used as often as needed for congestion.  If you develop worsening sinus pain, fever or notice severe headache and vision changes, or if symptoms are not better after completion of antibiotic, please schedule an appointment with a health care provider.    Sinus infections are not as easily transmitted as other respiratory infection, however we still recommend that you avoid close contact with loved ones, especially the very young and elderly.  Remember to wash your hands thoroughly throughout the day as this is the number one way to prevent the spread of infection!  Home Care:  Only take medications as instructed by your medical team.  Complete the entire course of an antibiotic.  Do not take these medications with alcohol.  A steam or ultrasonic humidifier can help congestion.  You can place a towel over your head and breathe in the steam from hot water coming from a faucet.  Avoid close contacts especially the very young and the elderly.  Cover your mouth when you cough or sneeze.  Always remember to wash your hands.  Get Help Right Away If:  You develop worsening fever or sinus pain.  You develop a severe head ache or visual changes.  Your symptoms persist after you have completed your  treatment plan.  Make sure you  Understand these instructions.  Will watch your condition.  Will get help right away if you are not doing well or get worse.  Your e-visit answers were reviewed by a board certified advanced clinical practitioner to complete your personal care plan.  Depending on the condition, your plan could have included both over the counter or prescription medications.  If there is a problem please reply  once you have received a response from your provider.  Your safety is important to [place].  If you have drug allergies check your prescription carefully.    You can use MyChart to ask questions about today's visit, request a non-urgent call back, or ask for a work or school excuse for 24 hours related to this e-Visit. If it has been greater than 24 hours you will need to follow up with your provider, or enter a new e-Visit to address those concerns.  You will get an e-mail in the next two days asking about your experience.  I hope that your e-visit has been valuable and will speed your recovery. Thank you for using e-visits.

## 2016-07-22 ENCOUNTER — Telehealth: Payer: 59 | Admitting: Family

## 2016-07-22 DIAGNOSIS — N390 Urinary tract infection, site not specified: Secondary | ICD-10-CM | POA: Diagnosis not present

## 2016-07-22 MED ORDER — NITROFURANTOIN MONOHYD MACRO 100 MG PO CAPS: 100.0000 mg | ORAL_CAPSULE | Freq: Two times a day (BID) | ORAL | 0 refills | Status: DC

## 2016-07-22 NOTE — Progress Notes (Signed)
Thank you for the details you put in the comment boxes. Those details really help us take better care of you.   We are sorry that you are not feeling well.  Here is how we plan to help!  Based on what you shared with me it looks like you most likely have a simple urinary tract infection.  A UTI (Urinary Tract Infection) is a bacterial infection of the bladder.  Most cases of urinary tract infections are simple to treat but a key part of your care is to encourage you to drink plenty of fluids and watch your symptoms carefully.  I have prescribed MacroBid 100 mg twice a day for 5 days.  Your symptoms should gradually improve. Call us if the burning in your urine worsens, you develop worsening fever, back pain or pelvic pain or if your symptoms do not resolve after completing the antibiotic.  Urinary tract infections can be prevented by drinking plenty of water to keep your body hydrated.  Also be sure when you wipe, wipe from front to back and don't hold it in!  If possible, empty your bladder every 4 hours.  Your e-visit answers were reviewed by a board certified advanced clinical practitioner to complete your personal care plan.  Depending on the condition, your plan could have included both over the counter or prescription medications.  If there is a problem please reply  once you have received a response from your provider.  Your safety is important to us.  If you have drug allergies check your prescription carefully.    You can use MyChart to ask questions about today's visit, request a non-urgent call back, or ask for a work or school excuse for 24 hours related to this e-Visit. If it has been greater than 24 hours you will need to follow up with your provider, or enter a new e-Visit to address those concerns.   You will get an e-mail in the next two days asking about your experience.  I hope that your e-visit has been valuable and will speed your recovery. Thank you for using  e-visits.    

## 2016-09-18 DIAGNOSIS — N390 Urinary tract infection, site not specified: Secondary | ICD-10-CM | POA: Diagnosis not present

## 2016-09-18 DIAGNOSIS — R102 Pelvic and perineal pain: Secondary | ICD-10-CM | POA: Diagnosis not present

## 2016-12-26 DIAGNOSIS — H5203 Hypermetropia, bilateral: Secondary | ICD-10-CM | POA: Diagnosis not present

## 2016-12-26 DIAGNOSIS — H52223 Regular astigmatism, bilateral: Secondary | ICD-10-CM | POA: Diagnosis not present

## 2017-02-22 DIAGNOSIS — R002 Palpitations: Secondary | ICD-10-CM | POA: Diagnosis not present

## 2017-02-22 DIAGNOSIS — R000 Tachycardia, unspecified: Secondary | ICD-10-CM | POA: Diagnosis not present

## 2017-02-26 ENCOUNTER — Telehealth: Payer: Self-pay

## 2017-02-26 NOTE — Telephone Encounter (Signed)
Referral sent to scheduling. 

## 2017-03-08 ENCOUNTER — Ambulatory Visit (INDEPENDENT_AMBULATORY_CARE_PROVIDER_SITE_OTHER): Payer: 59

## 2017-03-08 ENCOUNTER — Other Ambulatory Visit: Payer: Self-pay | Admitting: Family Medicine

## 2017-03-08 DIAGNOSIS — R002 Palpitations: Secondary | ICD-10-CM

## 2017-05-22 MED FILL — AMOXICILLIN 500 MG CAPSULE: 500 | 10 days supply | Qty: 20 | Fill #0

## 2017-06-11 DIAGNOSIS — D1801 Hemangioma of skin and subcutaneous tissue: Secondary | ICD-10-CM | POA: Diagnosis not present

## 2017-06-11 DIAGNOSIS — D2239 Melanocytic nevi of other parts of face: Secondary | ICD-10-CM | POA: Diagnosis not present

## 2017-06-11 DIAGNOSIS — L814 Other melanin hyperpigmentation: Secondary | ICD-10-CM | POA: Diagnosis not present

## 2017-06-11 DIAGNOSIS — D2339 Other benign neoplasm of skin of other parts of face: Secondary | ICD-10-CM | POA: Diagnosis not present

## 2017-07-18 ENCOUNTER — Encounter: Payer: Self-pay | Admitting: Family

## 2017-07-18 ENCOUNTER — Ambulatory Visit: Payer: Self-pay | Admitting: Family

## 2017-07-18 VITALS — BP 100/64 | HR 57 | Temp 98.6°F | Ht 67.0 in | Wt 144.6 lb

## 2017-07-18 DIAGNOSIS — Z0000 Encounter for general adult medical examination without abnormal findings: Secondary | ICD-10-CM

## 2017-07-18 NOTE — Patient Instructions (Signed)
Exercising to Stay Healthy Exercising regularly is important. It has many health benefits, such as:  Improving your overall fitness, flexibility, and endurance.  Increasing your bone density.  Helping with weight control.  Decreasing your body fat.  Increasing your muscle strength.  Reducing stress and tension.  Improving your overall health.  In order to become healthy and stay healthy, it is recommended that you do moderate-intensity and vigorous-intensity exercise. You can tell that you are exercising at a moderate intensity if you have a higher heart rate and faster breathing, but you are still able to hold a conversation. You can tell that you are exercising at a vigorous intensity if you are breathing much harder and faster and cannot hold a conversation while exercising. How often should I exercise? Choose an activity that you enjoy and set realistic goals. Your health care provider can help you to make an activity plan that works for you. Exercise regularly as directed by your health care provider. This may include:  Doing resistance training twice each week, such as: ? Push-ups. ? Sit-ups. ? Lifting weights. ? Using resistance bands.  Doing a given intensity of exercise for a given amount of time. Choose from these options: ? 150 minutes of moderate-intensity exercise every week. ? 75 minutes of vigorous-intensity exercise every week. ? A mix of moderate-intensity and vigorous-intensity exercise every week.  Children, pregnant women, people who are out of shape, people who are overweight, and older adults may need to consult a health care provider for individual recommendations. If you have any sort of medical condition, be sure to consult your health care provider before starting a new exercise program. What are some exercise ideas? Some moderate-intensity exercise ideas include:  Walking at a rate of 1 mile in 15  minutes.  Biking.  Hiking.  Golfing.  Dancing.  Some vigorous-intensity exercise ideas include:  Walking at a rate of at least 4.5 miles per hour.  Jogging or running at a rate of 5 miles per hour.  Biking at a rate of at least 10 miles per hour.  Lap swimming.  Roller-skating or in-line skating.  Cross-country skiing.  Vigorous competitive sports, such as football, basketball, and soccer.  Jumping rope.  Aerobic dancing.  What are some everyday activities that can help me to get exercise?  Yard work, such as: ? Pushing a lawn mower. ? Raking and bagging leaves.  Washing and waxing your car.  Pushing a stroller.  Shoveling snow.  Gardening.  Washing windows or floors. How can I be more active in my day-to-day activities?  Use the stairs instead of the elevator.  Take a walk during your lunch break.  If you drive, park your car farther away from work or school.  If you take public transportation, get off one stop early and walk the rest of the way.  Make all of your phone calls while standing up and walking around.  Get up, stretch, and walk around every 30 minutes throughout the day. What guidelines should I follow while exercising?  Do not exercise so much that you hurt yourself, feel dizzy, or get very short of breath.  Consult your health care provider before starting a new exercise program.  Wear comfortable clothes and shoes with good support.  Drink plenty of water while you exercise to prevent dehydration or heat stroke. Body water is lost during exercise and must be replaced.  Work out until you breathe faster and your heart beats faster. This information is not   intended to replace advice given to you by your health care provider. Make sure you discuss any questions you have with your health care provider. Document Released: 02/25/2010 Document Revised: 07/01/2015 Document Reviewed: 06/26/2013 Elsevier Interactive Patient Education  2018  Elsevier Inc.  

## 2017-07-18 NOTE — Progress Notes (Signed)
Subjective:     Patient ID: Yesenia Baker, female   DOB: Jan 06, 1986, 32 y.o.   MRN: [id number]  HPI 32 year old female is in today for CPX. Declines Labs. Sees GYN.   Review of Systems  All other systems reviewed and are negative.  Past Medical History:  Diagnosis Date  . Abnormal Pap smear    2007  . Asthma    albuterol prn  . Headache(784.0)   . Hx of varicella   . Infection    UTI  . Vaginal Pap smear, abnormal     Social History   Socioeconomic History  . Marital status: Married    Spouse name: Not on file  . Number of children: Not on file  . Years of education: Not on file  . Highest education level: Not on file  Occupational History  . Not on file  Social Needs  . Financial resource strain: Not on file  . Food insecurity:    Worry: Not on file    Inability: Not on file  . Transportation needs:    Medical: Not on file    Non-medical: Not on file  Tobacco Use  . Smoking status: Never Smoker  . Smokeless tobacco: Never Used  Substance and Sexual Activity  . Alcohol use: No  . Drug use: No  . Sexual activity: Yes  Lifestyle  . Physical activity:    Days per week: Not on file    Minutes per session: Not on file  . Stress: Not on file  Relationships  . Social connections:    Talks on phone: Not on file    Gets together: Not on file    Attends religious service: Not on file    Active member of club or organization: Not on file    Attends meetings of clubs or organizations: Not on file    Relationship status: Not on file  . Intimate partner violence:    Fear of current or ex partner: Not on file    Emotionally abused: Not on file    Physically abused: Not on file    Forced sexual activity: Not on file  Other Topics Concern  . Not on file  Social History Narrative  . Not on file    Past Surgical History:  Procedure Laterality Date  . CRYOTHERAPY  cervix-2007  . LEEP  2007  . WISDOM TOOTH EXTRACTION      Family History  Problem Relation  Age of Onset  . Anesthesia problems Neg Hx   . Hypotension Neg Hx   . Malignant hyperthermia Neg Hx   . Pseudochol deficiency Neg Hx   . Diabetes Maternal Grandmother   . Hypertension Maternal Grandmother   . Diabetes Maternal Grandfather   . Hypertension Maternal Grandfather   . Alzheimer's disease Father   . Cancer Paternal Grandmother        ;esophogeal  . Asthma Mother   . Hypothyroidism Mother     Allergies  Allergen Reactions  . Ceclor [Cefaclor] Rash    Current Outpatient Medications on File Prior to Visit  Medication Sig Dispense Refill  . albuterol (PROVENTIL HFA;VENTOLIN HFA) 108 (90 BASE) MCG/ACT inhaler Inhale 2 puffs into the lungs every 6 (six) hours as needed for wheezing.    Marland Kitchen doxycycline (VIBRA-TABS) 100 MG tablet Take 1 tablet (100 mg total) by mouth 2 (two) times daily. 14 tablet 0  . nitrofurantoin, macrocrystal-monohydrate, (MACROBID) 100 MG capsule Take 1 capsule (100 mg total) by mouth 2 (  two) times daily. 10 capsule 0  . Prenatal Vit-Fe Fumarate-FA (PRENATAL MULTIVITAMIN) TABS Take 1 tablet by mouth daily at 12 noon.     No current facility-administered medications on file prior to visit.     BP 100/64   Pulse (!) 57   Temp 98.6 F (37 C)   Ht 5\' 7"  (1.702 m)   Wt 144 lb 9.6 oz (65.6 kg)   SpO2 99%   BMI 22.65 kg/m chart    Objective:   Physical Exam  Constitutional: She is oriented to person, place, and time. She appears well-developed and well-nourished.  HENT:  Head: Normocephalic and atraumatic.  Right Ear: External ear normal.  Left Ear: External ear normal.  Nose: Nose normal.  Mouth/Throat: Oropharynx is clear and moist.  Eyes: Pupils are equal, round, and reactive to light. Conjunctivae and EOM are normal.  Neck: Normal range of motion. Neck supple.  Cardiovascular: Normal rate, regular rhythm and normal heart sounds.  Pulmonary/Chest: Effort normal and breath sounds normal.  Abdominal: Soft. Bowel sounds are normal.   Musculoskeletal: Normal range of motion.  Neurological: She is alert and oriented to person, place, and time.  Skin: Skin is warm and dry.  Psychiatric: She has a normal mood and affect.       Assessment:    Stepheni was seen today for cpe.  Diagnoses and all orders for this visit:  Routine health maintenance      Plan:     See GYN as scheduled and PCP

## 2017-09-18 MED FILL — FLUCONAZOLE 150 MG TABS: 150 | 2 days supply | Qty: 2 | Fill #0

## 2017-09-20 ENCOUNTER — Ambulatory Visit: Payer: Self-pay | Admitting: Nurse Practitioner

## 2017-09-20 VITALS — BP 112/70 | HR 64 | Temp 97.8°F | Resp 18 | Wt 139.0 lb

## 2017-09-20 DIAGNOSIS — N760 Acute vaginitis: Secondary | ICD-10-CM

## 2017-09-20 DIAGNOSIS — B9689 Other specified bacterial agents as the cause of diseases classified elsewhere: Secondary | ICD-10-CM

## 2017-09-20 MED ORDER — METRONIDAZOLE 500 MG PO TABS: 500.0000 mg | ORAL_TABLET | Freq: Two times a day (BID) | ORAL | 0 refills | Status: AC

## 2017-09-20 MED FILL — metroNIDAZOLE 500 MG TABS: 500 | 7 days supply | Qty: 14 | Fill #0

## 2017-09-20 NOTE — Progress Notes (Signed)
Subjective:     Yesenia Baker is a 32 y.o. female who presents for evaluation of a vaginal itching. Symptoms have been present for 1 day. Vaginal symptoms: discharge described as white and with a "slight" odor and vulvar itching.  She denies abnormal bleeding, blisters, bumps, discharge, dyspareunia, odor and urinary symptoms of chills, cloudy urine, hematuria, lower abdominal pain, nausea, sweats, urinary frequency, urinary hesitancy and urinary urgency Sexually transmitted infection risk: very low risk of STD exposure. Menstrual flow: regular every 28-30 days.   The patient states she did contact her OB/GYN and she was prescribed Diflucan.  The patient was given 2 tablets.  The patient states she took one and continues to have vaginal itching.  Patient states that she has not taken cycle 1 at this time.  Does inform that she does have some relief however, her symptoms are still present.  The following portions of the patient's history were reviewed and updated as appropriate: allergies, current medications and past medical history.   Review of Systems Constitutional: negative Eyes: negative Ears, nose, mouth, throat, and face: negative Respiratory: negative Cardiovascular: negative Gastrointestinal: negative Genitourinary:positive for vaginal discharge and vaginal itching, negative for abnormal menstrual periods and genital lesions, decreased stream, dysuria, frequency and hematuria    Objective:    BP 112/70 (BP Location: Right Arm, Patient Position: Sitting, Cuff Size: Normal)   Pulse 64   Temp 97.8 F (36.6 C) (Oral)   Resp 18   Wt 139 lb (63 kg)   SpO2 95%   BMI 21.77 kg/m  General appearance: alert, cooperative and no distress Head: Normocephalic, without obvious abnormality, atraumatic Eyes: conjunctivae/corneas clear. PERRL, EOM's intact. Fundi benign. Ears: normal TM's and external ear canals both ears Nose: Nares normal. Septum midline. Mucosa normal. No drainage or sinus  tenderness. Throat: lips, mucosa, and tongue normal; teeth and gums normal Lungs: clear to auscultation bilaterally Heart: regular rate and rhythm, S1, S2 normal, no murmur, click, rub or gallop Abdomen: soft, non-tender; bowel sounds normal; no masses,  no organomegaly Pulses: 2+ and symmetric Skin: Skin color, texture, turgor normal. No rashes or lesions Lymph nodes: cervical and submandibular nodes normal Neurologic: Grossly normal    Assessment:    Bacterial vaginosis.    Plan:   Exam findings, diagnosis etiology and medication use and indications reviewed with patient. Follow- Up and discharge instructions provided. No emergent/urgent issues found on exam.  Patient verbalized understanding of information provided and agrees with plan of care (POC), all questions answered.  1. Bacterial vaginosis  - metroNIDAZOLE (FLAGYL) 500 MG tablet; Take 1 tablet (500 mg total) by mouth 2 (two) times daily for 7 days.  Dispense: 14 tablet; Refill: 0 -Take 2nd Diflucan as prescribed. -If symptoms persist, may start Metronidazole. -Follow up with GYN if symptoms do not improve. -Avoid sexual intercourse and alcohol while taking medication.

## 2017-09-20 NOTE — Patient Instructions (Signed)
Bacterial Vaginosis -Take 2nd Diflucan as prescribed. -If symptoms persist, may start Metronidazole. -Follow up with GYN if symptoms do not improve. -Avoid sexual intercourse and alcohol while taking medication.   Bacterial vaginosis is a vaginal infection that occurs when the normal balance of bacteria in the vagina is disrupted. It results from an overgrowth of certain bacteria. This is the most common vaginal infection among women ages 50-44. Because bacterial vaginosis increases your risk for STIs (sexually transmitted infections), getting treated can help reduce your risk for chlamydia, gonorrhea, herpes, and HIV (human immunodeficiency virus). Treatment is also important for preventing complications in pregnant women, because this condition can cause an early (premature) delivery. What are the causes? This condition is caused by an increase in harmful bacteria that are normally present in small amounts in the vagina. However, the reason that the condition develops is not fully understood. What increases the risk? The following factors may make you more likely to develop this condition:  Having a new sexual partner or multiple sexual partners.  Having unprotected sex.  Douching.  Having an intrauterine device (IUD).  Smoking.  Drug and alcohol abuse.  Taking certain antibiotic medicines.  Being pregnant.  You cannot get bacterial vaginosis from toilet seats, bedding, swimming pools, or contact with objects around you. What are the signs or symptoms? Symptoms of this condition include:  Grey or white vaginal discharge. The discharge can also be watery or foamy.  A fish-like odor with discharge, especially after sexual intercourse or during menstruation.  Itching in and around the vagina.  Burning or pain with urination.  Some women with bacterial vaginosis have no signs or symptoms. How is this diagnosed? This condition is diagnosed based on:  Your medical  history.  A physical exam of the vagina.  Testing a sample of vaginal fluid under a microscope to look for a large amount of bad bacteria or abnormal cells. Your health care provider may use a cotton swab or a small wooden spatula to collect the sample.  How is this treated? This condition is treated with antibiotics. These may be given as a pill, a vaginal cream, or a medicine that is put into the vagina (suppository). If the condition comes back after treatment, a second round of antibiotics may be needed. Follow these instructions at home: Medicines  Take over-the-counter and prescription medicines only as told by your health care provider.  Take or use your antibiotic as told by your health care provider. Do not stop taking or using the antibiotic even if you start to feel better. General instructions  If you have a female sexual partner, tell her that you have a vaginal infection. She should see her health care provider and be treated if she has symptoms. If you have a female sexual partner, he does not need treatment.  During treatment: ? Avoid sexual activity until you finish treatment. ? Do not douche. ? Avoid alcohol as directed by your health care provider. ? Avoid breastfeeding as directed by your health care provider.  Drink enough water and fluids to keep your urine clear or pale yellow.  Keep the area around your vagina and rectum clean. ? Wash the area daily with warm water. ? Wipe yourself from front to back after using the toilet.  Keep all follow-up visits as told by your health care provider. This is important. How is this prevented?  Do not douche.  Wash the outside of your vagina with warm water only.  Use protection when  having sex. This includes latex condoms and dental dams.  Limit how many sexual partners you have. To help prevent bacterial vaginosis, it is best to have sex with just one partner (monogamous).  Make sure you and your sexual partner are  tested for STIs.  Wear cotton or cotton-lined underwear.  Avoid wearing tight pants and pantyhose, especially during summer.  Limit the amount of alcohol that you drink.  Do not use any products that contain nicotine or tobacco, such as cigarettes and e-cigarettes. If you need help quitting, ask your health care provider.  Do not use illegal drugs. Where to find more information:  Centers for Disease Control and Prevention: AppraiserFraud.fi  American Sexual Health Association (ASHA): www.ashastd.org  [place] Department of Health and Financial controller, Office on Women's Health: DustingSprays.pl or SecuritiesCard.it Contact a health care provider if:  Your symptoms do not improve, even after treatment.  You have more discharge or pain when urinating.  You have a fever.  You have pain in your abdomen.  You have pain during sex.  You have vaginal bleeding between periods. Summary  Bacterial vaginosis is a vaginal infection that occurs when the normal balance of bacteria in the vagina is disrupted.  Because bacterial vaginosis increases your risk for STIs (sexually transmitted infections), getting treated can help reduce your risk for chlamydia, gonorrhea, herpes, and HIV (human immunodeficiency virus). Treatment is also important for preventing complications in pregnant women, because the condition can cause an early (premature) delivery.  This condition is treated with antibiotic medicines. These may be given as a pill, a vaginal cream, or a medicine that is put into the vagina (suppository). This information is not intended to replace advice given to you by your health care provider. Make sure you discuss any questions you have with your health care provider. Document Released: 01/23/2005 Document Revised: 05/29/2016 Document Reviewed: 10/09/2015 Elsevier Interactive Patient Education  [employer].

## 2017-09-25 MED FILL — NITROFURANTOIN MONO-MCR 100: 100 | 7 days supply | Qty: 14 | Fill #0

## 2017-09-28 ENCOUNTER — Telehealth: Payer: 59 | Admitting: Family

## 2017-09-28 DIAGNOSIS — N390 Urinary tract infection, site not specified: Secondary | ICD-10-CM | POA: Diagnosis not present

## 2017-09-28 MED ORDER — CIPROFLOXACIN HCL 500 MG PO TABS: 500.0000 mg | ORAL_TABLET | Freq: Two times a day (BID) | ORAL | 0 refills | Status: DC

## 2017-09-28 NOTE — Progress Notes (Signed)
Thank you for the details you included in the comment boxes. Those details are very helpful in determining the best course of treatment for you and help [place] to provide the best care.  We are sorry that you are not feeling well.  Here is how we plan to help!  Based on what you shared with me it looks like you most likely have a simple urinary tract infection.  A UTI (Urinary Tract Infection) is a bacterial infection of the bladder.  Most cases of urinary tract infections are simple to treat but a key part of your care is to encourage you to drink plenty of fluids and watch your symptoms carefully.  I have prescribed Ciprofloxacin 500 mg twice a day for 5 days.  Your symptoms should gradually improve. Call us if the burning in your urine worsens, you develop worsening fever, back pain or pelvic pain or if your symptoms do not resolve after completing the antibiotic.  Urinary tract infections can be prevented by drinking plenty of water to keep your body hydrated.  Also be sure when you wipe, wipe from front to back and don't hold it in!  If possible, empty your bladder every 4 hours.  Your e-visit answers were reviewed by a board certified advanced clinical practitioner to complete your personal care plan.  Depending on the condition, your plan could have included both over the counter or prescription medications.  If there is a problem please reply  once you have received a response from your provider.  Your safety is important to [place].  If you have drug allergies check your prescription carefully.    You can use MyChart to ask questions about today's visit, request a non-urgent call back, or ask for a work or school excuse for 24 hours related to this e-Visit. If it has been greater than 24 hours you will need to follow up with your provider, or enter a new e-Visit to address those concerns.   You will get an e-mail in the next two days asking about your experience.  I hope that your e-visit has  been valuable and will speed your recovery. Thank you for using e-visits.

## 2017-11-23 DIAGNOSIS — Z23 Encounter for immunization: Secondary | ICD-10-CM | POA: Diagnosis not present

## 2017-12-12 DIAGNOSIS — Z6821 Body mass index (BMI) 21.0-21.9, adult: Secondary | ICD-10-CM | POA: Diagnosis not present

## 2017-12-12 DIAGNOSIS — Z01419 Encounter for gynecological examination (general) (routine) without abnormal findings: Secondary | ICD-10-CM | POA: Diagnosis not present

## 2017-12-18 ENCOUNTER — Telehealth: Payer: 59 | Admitting: Family Medicine

## 2017-12-18 DIAGNOSIS — B373 Candidiasis of vulva and vagina: Secondary | ICD-10-CM

## 2017-12-18 DIAGNOSIS — B3731 Acute candidiasis of vulva and vagina: Secondary | ICD-10-CM

## 2017-12-18 MED ORDER — FLUCONAZOLE 150 MG PO TABS: 150.0000 mg | ORAL_TABLET | Freq: Once | ORAL | 0 refills | Status: AC

## 2017-12-18 MED FILL — FLUCONAZOLE 150 MG TABS: 150 | 1 days supply | Qty: 1 | Fill #0

## 2017-12-18 NOTE — Progress Notes (Signed)

## 2017-12-27 ENCOUNTER — Ambulatory Visit: Payer: Self-pay | Admitting: Nurse Practitioner

## 2017-12-27 ENCOUNTER — Encounter: Payer: Self-pay | Admitting: Nurse Practitioner

## 2017-12-27 VITALS — BP 100/72 | HR 84 | Temp 98.6°F | Wt 143.2 lb

## 2017-12-27 DIAGNOSIS — J209 Acute bronchitis, unspecified: Secondary | ICD-10-CM

## 2017-12-27 DIAGNOSIS — R062 Wheezing: Secondary | ICD-10-CM

## 2017-12-27 MED ORDER — ALBUTEROL SULFATE HFA 108 (90 BASE) MCG/ACT IN AERS: 2.0000 | INHALATION_SPRAY | Freq: Four times a day (QID) | RESPIRATORY_TRACT | 0 refills | Status: DC | PRN

## 2017-12-27 MED ORDER — BENZONATATE 200 MG PO CAPS: 200.0000 mg | ORAL_CAPSULE | Freq: Three times a day (TID) | ORAL | 0 refills | Status: AC | PRN

## 2017-12-27 MED ORDER — ALBUTEROL SULFATE (2.5 MG/3ML) 0.083% IN NEBU: 2.5000 mg | INHALATION_SOLUTION | Freq: Four times a day (QID) | RESPIRATORY_TRACT | 1 refills | Status: DC | PRN

## 2017-12-27 MED ORDER — PREDNISONE 10 MG (21) PO TBPK: ORAL_TABLET | ORAL | 0 refills | Status: DC

## 2017-12-27 MED FILL — VENTOLIN HFA 90 MCG INHALER: 108 (90 BAS | 25 days supply | Qty: 18 | Fill #0

## 2017-12-27 MED FILL — ALBUTEROL 0.083% INHAL SOLN: (2.5 MG/3ML | 25 days supply | Qty: 150 | Fill #0

## 2017-12-27 MED FILL — predniSONE 10 MG TABS: 10 | 6 days supply | Qty: 21 | Fill #0

## 2017-12-27 MED FILL — BENZONATATE 200 MG CAPS: 200 | 10 days supply | Qty: 30 | Fill #0

## 2017-12-27 NOTE — Progress Notes (Signed)
Subjective:     Yesenia Baker is a 32 y.o. female here for evaluation of a cough.  The cough is productive of green/yellow sputum, with wheezing, with shortness of breath during the cough, waxing and waning over time and is aggravated by reclining position. Onset of symptoms was 7 days ago, gradually worsening since that time.  Associated symptoms include fever, shortness of breath, sputum production and wheezing. Patient does have a history of asthma. Patient has not had recent travel. Patient does not have a history of smoking.  Patient states her asthma is well controlled at this time, but only exacerbates when she becomes ill.  Patient has been using her nebulizer treatment about 2-3 times over the past week she states she is currently out of her albuterol inhaler.  Patient states she is also been taking Tylenol and ibuprofen with minimal relief.   The following portions of the patient's history were reviewed and updated as appropriate: allergies, current medications and past medical history.  Review of Systems Constitutional: positive for anorexia, fatigue and fevers, negative for malaise, sweats and weight loss Eyes: negative Ears, nose, mouth, throat, and face: positive for nasal congestion and sore throat, negative for ear drainage, earaches and hoarseness Respiratory: positive for asthma, cough, dyspnea on exertion, sputum and wheezing, negative for chronic bronchitis, pleurisy/chest pain and pneumonia Cardiovascular: negative Gastrointestinal: positive for nausea, negative for abdominal pain, constipation, diarrhea and vomiting Neurological: negative     Objective:   BP 100/72   Pulse 84   Temp 98.6 F (37 C)   Wt 143 lb 3.2 oz (65 kg)   SpO2 99%   BMI 22.43 kg/m  General appearance: alert, cooperative, fatigued and no distress Head: Normocephalic, without obvious abnormality, atraumatic Eyes: conjunctivae/corneas clear. PERRL, EOM's intact. Fundi benign. Ears: normal TM's  and external ear canals both ears Nose: no discharge, no congestion, mild maxillary sinus tenderness bilateral Throat: lips, mucosa, and tongue normal; teeth and gums normal Lungs: rhonchi LLL, LUL, RLL and RUL and wheezes LLL and RLL Heart: regular rate and rhythm, S1, S2 normal, no murmur, click, rub or gallop Abdomen: soft, non-tender; bowel sounds normal; no masses,  no organomegaly Pulses: 2+ and symmetric Skin: Skin color, texture, turgor normal. No rashes or lesions Lymph nodes: cervical and submandibular nodes normal Neurologic: Grossly normal    Assessment:    Acute Bronchitis    Plan:   Exam findings, diagnosis etiology and medication use and indications reviewed with patient. Follow- Up and discharge instructions provided. No emergent/urgent issues found on exam.  Discussed with patient that due to her symptom presentation, antibiotics were not necessary at this time.  Discussed with patient that I feel she would receive more benefit from a steroid due to her lung sounds.  Patient did not display fever, purulent sputum production, or purulent nasal production.  Also provided refills on patient's asthma inhaler and nebulizer treatment to help until symptoms improve.  Patient was instructed to follow-up if she did not improve within the next 3 to 5 days.  Patient education was provided. Patient verbalized understanding of information provided and agrees with plan of care (POC), all questions answered. The patient is advised to call or return to clinic if condition does not see an improvement in symptoms, or to seek the care of the closest emergency department if condition worsens with the above plan.   1. Acute bronchitis, unspecified organism  - predniSONE (STERAPRED UNI-PAK 21 TAB) 10 MG (21) TBPK tablet; Take as directed.  Dispense: 21 tablet; Refill: 0 - benzonatate (TESSALON) 200 MG capsule; Take 1 capsule (200 mg total) by mouth 3 (three) times daily as needed for up to 10 days  for cough.  Dispense: 30 capsule; Refill: 0 - albuterol (PROVENTIL HFA;VENTOLIN HFA) 108 (90 Base) MCG/ACT inhaler; Inhale 2 puffs into the lungs every 6 (six) hours as needed for up to 10 days for wheezing or shortness of breath.  Dispense: 1 Inhaler; Refill: 0  2. Wheezing  - albuterol (PROVENTIL HFA;VENTOLIN HFA) 108 (90 Base) MCG/ACT inhaler; Inhale 2 puffs into the lungs every 6 (six) hours as needed for up to 10 days for wheezing or shortness of breath.  Dispense: 1 Inhaler; Refill: 0 - albuterol (PROVENTIL) (2.5 MG/3ML) 0.083% nebulizer solution; Take 3 mLs (2.5 mg total) by nebulization every 6 (six) hours as needed for up to 25 days for wheezing or shortness of breath.  Dispense: 150 mL; Refill: 1

## 2017-12-27 NOTE — Patient Instructions (Signed)
Acute Bronchitis, Adult -Take medication as prescribed. -Ibuprofen or Tylenol for pain, fever, or general discomfort. -Increase fluids. -Sleep elevated on at least 2 pillows at bedtime to help with cough. -Use a humidifier or vaporizer when at home and during sleep. -May use a teaspoon of honey or over-the-counter cough drops to help with cough. -Perform nebulizer treatment as needed until symptoms improve. -Follow-up if symptoms do not improve.  Acute bronchitis is sudden (acute) swelling of the air tubes (bronchi) in the lungs. Acute bronchitis causes these tubes to fill with mucus, which can make it hard to breathe. It can also cause coughing or wheezing. In adults, acute bronchitis usually goes away within 2 weeks. A cough caused by bronchitis may last up to 3 weeks. Smoking, allergies, and asthma can make the condition worse. Repeated episodes of bronchitis may cause further lung problems, such as chronic obstructive pulmonary disease (COPD). What are the causes? This condition can be caused by germs and by substances that irritate the lungs, including:  Cold and flu viruses. This condition is most often caused by the same virus that causes a cold.  Bacteria.  Exposure to tobacco smoke, dust, fumes, and air pollution.  What increases the risk? This condition is more likely to develop in people who:  Have close contact with someone with acute bronchitis.  Are exposed to lung irritants, such as tobacco smoke, dust, fumes, and vapors.  Have a weak immune system.  Have a respiratory condition such as asthma.  What are the signs or symptoms? Symptoms of this condition include:  A cough.  Coughing up clear, yellow, or green mucus.  Wheezing.  Chest congestion.  Shortness of breath.  A fever.  Body aches.  Chills.  A sore throat.  How is this diagnosed? This condition is usually diagnosed with a physical exam. During the exam, your health care provider may order  tests, such as chest X-rays, to rule out other conditions. He or she may also:  Test a sample of your mucus for bacterial infection.  Check the level of oxygen in your blood. This is done to check for pneumonia.  Do a chest X-ray or lung function testing to rule out pneumonia and other conditions.  Perform blood tests.  Your health care provider will also ask about your symptoms and medical history. How is this treated? Most cases of acute bronchitis clear up over time without treatment. Your health care provider may recommend:  Drinking more fluids. Drinking more makes your mucus thinner, which may make it easier to breathe.  Taking a medicine for a fever or cough.  Taking an antibiotic medicine.  Using an inhaler to help improve shortness of breath and to control a cough.  Using a cool mist vaporizer or humidifier to make it easier to breathe.  Follow these instructions at home: Medicines  Take over-the-counter and prescription medicines only as told by your health care provider.  If you were prescribed an antibiotic, take it as told by your health care provider. Do not stop taking the antibiotic even if you start to feel better. General instructions  Get plenty of rest.  Drink enough fluids to keep your urine clear or pale yellow.  Avoid smoking and secondhand smoke. Exposure to cigarette smoke or irritating chemicals will make bronchitis worse. If you smoke and you need help quitting, ask your health care provider. Quitting smoking will help your lungs heal faster.  Use an inhaler, cool mist vaporizer, or humidifier as told by your  health care provider.  Keep all follow-up visits as told by your health care provider. This is important. How is this prevented? To lower your risk of getting this condition again:  Wash your hands often with soap and water. If soap and water are not available, use hand sanitizer.  Avoid contact with people who have cold symptoms.  Try  not to touch your hands to your mouth, nose, or eyes.  Make sure to get the flu shot every year.  Contact a health care provider if:  Your symptoms do not improve in 2 weeks of treatment. Get help right away if:  You cough up blood.  You have chest pain.  You have severe shortness of breath.  You become dehydrated.  You faint or keep feeling like you are going to faint.  You keep vomiting.  You have a severe headache.  Your fever or chills gets worse. This information is not intended to replace advice given to you by your health care provider. Make sure you discuss any questions you have with your health care provider. Document Released: 03/02/2004 Document Revised: 08/18/2015 Document Reviewed: 07/14/2015 Elsevier Interactive Patient Education  2018 [employer].  Bronchospasm, Adult Bronchospasm is a tightening of the airways going into the lungs. During an episode, it may be harder to breathe. You may cough, and you may make a whistling sound when you breathe (wheeze). This condition often affects people with asthma. What are the causes? This condition is caused by swelling and irritation in the airways. It can be triggered by:  An infection (common).  Seasonal allergies.  An allergic reaction.  Exercise.  Irritants. These include pollution, cigarette smoke, strong odors, aerosol sprays, and paint fumes.  Weather changes. Winds increase molds and pollens in the air. Cold air may cause swelling.  Stress and emotional upset.  What are the signs or symptoms? Symptoms of this condition include:  Wheezing. If the episode was triggered by an allergy, wheezing may start right away or hours later.  Nighttime coughing.  Frequent or severe coughing with a simple cold.  Chest tightness.  Shortness of breath.  Decreased ability to exercise.  How is this diagnosed? This condition is usually diagnosed with a review of your medical history and a physical exam.  Tests, such as lung function tests, are sometimes done to look for other conditions. The need for a chest X-ray depends on where the wheezing occurs and whether it is the first time you have wheezed. How is this treated? This condition may be treated with:  Inhaled medicines. These open up the airways and help you breathe. They can be taken with an inhaler or a nebulizer device.  Corticosteroid medicines. These may be given for severe bronchospasm, usually when it is associated with asthma.  Avoiding triggers, such as irritants, infection, or allergies.  Follow these instructions at home: Medicines  Take over-the-counter and prescription medicines only as told by your health care provider.  If you need to use an inhaler or nebulizer to take your medicine, ask your health care provider to explain how to use it correctly. If you were given a spacer, always use it with your inhaler. Lifestyle  Reduce the number of triggers in your home. To do this: ? Change your heating and air conditioning filter at least once a month. ? Limit your use of fireplaces and wood stoves. ? Do not smoke. Do not allow smoking in your home. ? Avoid using perfumes and fragrances. ? Get rid of  pests, such as roaches and mice, and their droppings. ? Remove any mold from your home. ? Keep your house clean and dust free. Use unscented cleaning products. ? Replace carpet with wood, tile, or vinyl flooring. Carpet can trap dander and dust. ? Use allergy-proof pillows, mattress covers, and box spring covers. ? Wash bed sheets and blankets every week in hot water. Dry them in a dryer. ? Use blankets that are made of polyester or cotton. ? Wash your hands often. ? Do not allow pets in your bedroom.  Avoid breathing in cold air when you exercise. General instructions  Have a plan for seeking medical care. Know when to call your health care provider and local emergency services, and where to get emergency care.  Stay  up to date on your immunizations.  When you have an episode of bronchospasm, stay calm. Try to relax and breathe more slowly.  If you have asthma, make sure you have an asthma action plan.  Keep all follow-up visits as told by your health care provider. This is important. Contact a health care provider if:  You have muscle aches.  You have chest pain.  The mucus that you cough up (sputum) changes from clear or white to yellow, green, gray, or bloody.  You have a fever.  Your sputum gets thicker. Get help right away if:  Your wheezing and coughing get worse, even after you take your prescribed medicines.  It gets even harder to breathe.  You develop severe chest pain. Summary  Bronchospasm is a tightening of the airways going into the lungs.  During an episode of bronchospasm, you may have a harder time breathing. You may cough and make a whistling sound when you breathe (wheeze).  Avoid exposure to triggers such as smoke, dust, mold, animal dander, and fragrances.  When you have an episode of bronchospasm, stay calm. Try to relax and breathe more slowly. This information is not intended to replace advice given to you by your health care provider. Make sure you discuss any questions you have with your health care provider. Document Released: 01/26/2003 Document Revised: 01/20/2016 Document Reviewed: 01/20/2016 Elsevier Interactive Patient Education  2017 [employer].

## 2018-01-14 DIAGNOSIS — M25531 Pain in right wrist: Secondary | ICD-10-CM | POA: Diagnosis not present

## 2018-02-19 ENCOUNTER — Encounter: Payer: Self-pay | Admitting: Nurse Practitioner

## 2018-02-19 ENCOUNTER — Ambulatory Visit: Payer: Self-pay | Admitting: Nurse Practitioner

## 2018-02-19 VITALS — BP 100/64 | HR 78 | Temp 98.8°F | Resp 10 | Wt 142.6 lb

## 2018-02-19 DIAGNOSIS — L259 Unspecified contact dermatitis, unspecified cause: Secondary | ICD-10-CM

## 2018-02-19 MED ORDER — PREDNISONE 10 MG (21) PO TBPK: ORAL_TABLET | ORAL | 0 refills | Status: DC

## 2018-02-19 MED FILL — predniSONE 10 MG TABS: 10 | 6 days supply | Qty: 21 | Fill #0

## 2018-02-19 NOTE — Progress Notes (Signed)
Subjective:     Yesenia Baker is a 33 y.o. female who presents for evaluation of a rash involving the generalized body surface area. Rash started 1 day ago. Lesions are red, and flat in texture. Rash has changed over time. Rash is pruritic and "feels like a burning sensation". Associated symptoms: fever approximately 3 days ago, but has since resolved. Patient denies: abdominal pain, congestion, decrease in appetite, nausea, sore throat and vomiting. Patient has not had contacts with similar rash. Patient has not had new exposures (soaps, lotions, laundry detergents, foods, medications, plants, insects or animals).  The following portions of the patient's history were reviewed and updated as appropriate: allergies, current medications and past medical history.  Review of Systems Constitutional: positive for fevers, negative for anorexia, chills, fatigue, malaise and sweats Eyes: negative Ears, nose, mouth, throat, and face: negative Respiratory: negative Cardiovascular: negative Gastrointestinal: negative Integument/breast: positive for pruritus, rash and skin color change, negative for discharge     Objective:    BP 100/64   Pulse 78   Temp 98.8 F (37.1 C)   Resp 10   Wt 142 lb 9.6 oz (64.7 kg)   BMI 22.33 kg/m  General:  alert, cooperative and no distress  Skin:   Widespread erythematous maculopapular rash, rash is located to the patient's generalized body surface area.  No discharge, no vesicles, no crust, patches, or plaques noted.     Assessment:    contact dermatitis: Unknown Trigger    Plan:   Exam findings, diagnosis etiology and medication use and indications reviewed with patient. Follow- Up and discharge instructions provided. No emergent/urgent issues found on exam.  She was prescribed Sterapred Dosepak for the inflammatory response.  Patient denies any unknown trigger, but due to clinical presentation, appearance of the rash, and physical assessment, I do not feel  this is of an infectious nature.  Patient education was provided. Patient verbalized understanding of information provided and agrees with plan of care (POC), all questions answered. The patient is advised to call or return to clinic if condition does not see an improvement in symptoms, or to seek the care of the closest emergency department if condition worsens with the above plan.    1. Contact dermatitis, unspecified contact dermatitis type, unspecified trigger  - predniSONE (STERAPRED UNI-PAK 21 TAB) 10 MG (21) TBPK tablet; Take as directed.  Dispense: 21 tablet; Refill: 0 -Take medications as prescribed. -Purchase over-the-counter Pepcid AC 20 mg.  Also purchase Zyrtec 10 mg.  Take each of these medications with the prednisone prescribed. -May take Benadryl 25 mg at bedtime as needed for itching. -Purchase over-the-counter Aveeno colloidal oatmeal bath.  They use in the solution at least 2 times daily until symptoms improve. -Avoid scratching the affected areas. -Avoid hot baths or showers. -If symptoms do not improve follow-up with your regular doctor.

## 2018-02-19 NOTE — Patient Instructions (Addendum)
Contact Dermatitis -Take medications as prescribed. -Purchase over-the-counter Pepcid AC 20 mg.  Also purchase Zyrtec 10 mg.  Take each of these medications with the prednisone prescribed. -May take Benadryl 25 mg at bedtime as needed for itching. -Purchase over-the-counter Aveeno colloidal oatmeal bath.  They use in the solution at least 2 times daily until symptoms improve. -Avoid scratching the affected areas. -Avoid hot baths or showers. -If symptoms do not improve follow-up with your regular doctor.  Dermatitis is redness, soreness, and swelling (inflammation) of the skin. Contact dermatitis is a reaction to certain substances that touch the skin. Many different substances can cause contact dermatitis. There are two types of contact dermatitis:  Irritant contact dermatitis. This type is caused by something that irritates your skin, such as having dry hands from washing them too often with soap. This type does not require previous exposure to the substance for a reaction to occur. This is the most common type.  Allergic contact dermatitis. This type is caused by a substance that you are allergic to, such as poison ivy. This type occurs when you have been exposed to the substance (allergen) and develop a sensitivity to it. Dermatitis may develop soon after your first exposure to the allergen, or it may not develop until the next time you are exposed and every time thereafter. What are the causes? Irritant contact dermatitis is most commonly caused by exposure to:  Makeup.  Soaps.  Detergents.  Bleaches.  Acids.  Metal salts, such as nickel. Allergic contact dermatitis is most commonly caused by exposure to:  Poisonous plants.  Chemicals.  Jewelry.  Latex.  Medicines.  Preservatives in products, such as clothing. What increases the risk? You are more likely to develop this condition if you have:  A job that exposes you to irritants or allergens.  Certain medical  conditions, such as asthma or eczema. What are the signs or symptoms? Symptoms of this condition may occur on your body anywhere the irritant has touched you or is touched by you.  Symptoms include: ? Dryness or flaking. ? Redness. ? Cracks. ? Itching. ? Pain or a burning feeling. ? Blisters. ? Drainage of small amounts of blood or clear fluid from skin cracks. With allergic contact dermatitis, there may also be swelling in areas such as the eyelids, mouth, or genitals. How is this diagnosed? This condition is diagnosed with a medical history and physical exam.  A patch skin test may be performed to help determine the cause.  If the condition is related to your job, you may need to see an occupational medicine specialist. How is this treated? This condition is treated by checking for the cause of the reaction and protecting your skin from further contact. Treatment may also include:  Steroid creams or ointments. Oral steroid medicines may be needed in more severe cases.  Antibiotic medicines or antibacterial ointments, if a skin infection is present.  Antihistamine lotion or an antihistamine taken by mouth to ease itching.  A bandage (dressing). Follow these instructions at home: Skin care  Moisturize your skin as needed.  Apply cool compresses to the affected areas.  Try applying baking soda paste to your skin. Stir water into baking soda until it reaches a paste-like consistency.  Do not scratch your skin, and avoid friction to the affected area.  Avoid the use of soaps, perfumes, and dyes. Medicines  Take or apply over-the-counter and prescription medicines only as told by your health care provider.  If you were prescribed  an antibiotic medicine, take or apply the antibiotic as told by your health care provider. Do not stop using the antibiotic even if your condition improves. Bathing  Try taking a bath with: ? Epsom salts. Follow the instructions on the packaging.  You can get these at your local pharmacy or grocery store. ? Baking soda. Pour a small amount into the bath as directed by your health care provider. ? Colloidal oatmeal. Follow the instructions on the packaging. You can get this at your local pharmacy or grocery store.  Bathe less frequently, such as every other day.  Bathe in lukewarm water. Avoid using hot water. Bandage care  If you were given a bandage (dressing), change it as told by your health care provider.  Wash your hands with soap and water before and after you change your dressing. If soap and water are not available, use hand sanitizer. General instructions  Avoid the substance that caused your reaction. If you do not know what caused it, keep a journal to try to track what caused it. Write down: ? What you eat. ? What cosmetic products you use. ? What you drink. ? What you wear in the affected area. This includes jewelry.  Check the affected areas every day for signs of infection. Check for: ? More redness, swelling, or pain. ? More fluid or blood. ? Warmth. ? Pus or a bad smell.  Keep all follow-up visits as told by your health care provider. This is important. Contact a health care provider if:  Your condition does not improve with treatment.  Your condition gets worse.  You have signs of infection such as swelling, tenderness, redness, soreness, or warmth in the affected area.  You have a fever.  You have new symptoms. Get help right away if:  You have a severe headache, neck pain, or neck stiffness.  You vomit.  You feel very sleepy.  You notice red streaks coming from the affected area.  Your bone or joint underneath the affected area becomes painful after the skin has healed.  The affected area turns darker.  You have difficulty breathing. Summary  Dermatitis is redness, soreness, and swelling (inflammation) of the skin. Contact dermatitis is a reaction to certain substances that touch the  skin.  Symptoms of this condition may occur on your body anywhere the irritant has touched you or is touched by you.  This condition is treated by figuring out what caused the reaction and protecting your skin from further contact. Treatment may also include medicines and skin care.  Avoid the substance that caused your reaction. If you do not know what caused it, keep a journal to try to track what caused it.  Contact a health care provider if your condition gets worse or you have signs of infection such as swelling, tenderness, redness, soreness, or warmth in the affected area. This information is not intended to replace advice given to you by your health care provider. Make sure you discuss any questions you have with your health care provider. Document Released: 01/21/2000 Document Revised: 08/08/2017 Document Reviewed: 08/08/2017 Elsevier Interactive Patient Education  2019 [employer].

## 2018-02-21 ENCOUNTER — Telehealth: Payer: Self-pay

## 2018-02-21 NOTE — Telephone Encounter (Signed)
Patient did not answered the phone. I asked to call us back.  

## 2018-07-04 DIAGNOSIS — L237 Allergic contact dermatitis due to plants, except food: Secondary | ICD-10-CM | POA: Diagnosis not present

## 2018-08-27 DIAGNOSIS — Z0184 Encounter for antibody response examination: Secondary | ICD-10-CM | POA: Diagnosis not present

## 2018-08-27 DIAGNOSIS — Z111 Encounter for screening for respiratory tuberculosis: Secondary | ICD-10-CM | POA: Diagnosis not present

## 2018-09-03 DIAGNOSIS — Z0184 Encounter for antibody response examination: Secondary | ICD-10-CM | POA: Diagnosis not present

## 2018-09-03 DIAGNOSIS — E871 Hypo-osmolality and hyponatremia: Secondary | ICD-10-CM | POA: Diagnosis not present

## 2018-09-03 DIAGNOSIS — D649 Anemia, unspecified: Secondary | ICD-10-CM | POA: Diagnosis not present

## 2018-09-03 DIAGNOSIS — Z13220 Encounter for screening for lipoid disorders: Secondary | ICD-10-CM | POA: Diagnosis not present

## 2018-09-03 DIAGNOSIS — Z0000 Encounter for general adult medical examination without abnormal findings: Secondary | ICD-10-CM | POA: Diagnosis not present

## 2018-09-03 DIAGNOSIS — Z111 Encounter for screening for respiratory tuberculosis: Secondary | ICD-10-CM | POA: Diagnosis not present

## 2018-10-04 ENCOUNTER — Telehealth: Payer: 59 | Admitting: Physician Assistant

## 2018-10-04 DIAGNOSIS — R300 Dysuria: Secondary | ICD-10-CM

## 2018-10-04 MED ORDER — NITROFURANTOIN MONOHYD MACRO 100 MG PO CAPS: 100.0000 mg | ORAL_CAPSULE | Freq: Two times a day (BID) | ORAL | 0 refills | Status: AC

## 2018-10-04 NOTE — Progress Notes (Signed)
We are sorry that you are not feeling well.  Here is how we plan to help!  Based on what you shared with me it looks like you most likely have a simple urinary tract infection.  A UTI (Urinary Tract Infection) is a bacterial infection of the bladder.  Most cases of urinary tract infections are simple to treat but a key part of your care is to encourage you to drink plenty of fluids and watch your symptoms carefully.  I have prescribed MacroBid 100 mg twice a day for 5 days.  Your symptoms should gradually improve. Call us if the burning in your urine worsens, you develop worsening fever, back pain or pelvic pain or if your symptoms do not resolve after completing the antibiotic.  Urinary tract infections can be prevented by drinking plenty of water to keep your body hydrated.  Also be sure when you wipe, wipe from front to back and don't hold it in!  If possible, empty your bladder every 4 hours.  Your e-visit answers were reviewed by a board certified advanced clinical practitioner to complete your personal care plan.  Depending on the condition, your plan could have included both over the counter or prescription medications.  If there is a problem please reply  once you have received a response from your provider.  Your safety is important to us.  If you have drug allergies check your prescription carefully.    You can use MyChart to ask questions about today's visit, request a non-urgent call back, or ask for a work or school excuse for 24 hours related to this e-Visit. If it has been greater than 24 hours you will need to follow up with your provider, or enter a new e-Visit to address those concerns.   You will get an e-mail in the next two days asking about your experience.  I hope that your e-visit has been valuable and will speed your recovery. Thank you for using e-visits.  I have spent 5 minutes in review of e-visit questionnaire, review and updating patient chart, medical decision  making and response to patient.    Kanya Potteiger, PA-C    

## 2018-11-20 MED FILL — CIPROFLOXACIN HCL 500 MG TA: 500 | 7 days supply | Qty: 14 | Fill #0

## 2019-01-20 DIAGNOSIS — Z01419 Encounter for gynecological examination (general) (routine) without abnormal findings: Secondary | ICD-10-CM | POA: Diagnosis not present

## 2019-01-20 DIAGNOSIS — Z6823 Body mass index (BMI) 23.0-23.9, adult: Secondary | ICD-10-CM | POA: Diagnosis not present

## 2019-01-20 DIAGNOSIS — R32 Unspecified urinary incontinence: Secondary | ICD-10-CM | POA: Diagnosis not present

## 2019-03-04 DIAGNOSIS — D2339 Other benign neoplasm of skin of other parts of face: Secondary | ICD-10-CM | POA: Diagnosis not present

## 2019-03-04 DIAGNOSIS — D224 Melanocytic nevi of scalp and neck: Secondary | ICD-10-CM | POA: Diagnosis not present

## 2019-03-04 DIAGNOSIS — L989 Disorder of the skin and subcutaneous tissue, unspecified: Secondary | ICD-10-CM | POA: Diagnosis not present

## 2019-03-04 DIAGNOSIS — D485 Neoplasm of uncertain behavior of skin: Secondary | ICD-10-CM | POA: Diagnosis not present

## 2019-05-27 DIAGNOSIS — Z8349 Family history of other endocrine, nutritional and metabolic diseases: Secondary | ICD-10-CM | POA: Diagnosis not present

## 2019-05-27 DIAGNOSIS — Z0184 Encounter for antibody response examination: Secondary | ICD-10-CM | POA: Diagnosis not present

## 2019-05-27 DIAGNOSIS — D649 Anemia, unspecified: Secondary | ICD-10-CM | POA: Diagnosis not present

## 2019-05-27 DIAGNOSIS — E871 Hypo-osmolality and hyponatremia: Secondary | ICD-10-CM | POA: Diagnosis not present

## 2019-05-27 DIAGNOSIS — R5383 Other fatigue: Secondary | ICD-10-CM | POA: Diagnosis not present

## 2019-05-27 DIAGNOSIS — R635 Abnormal weight gain: Secondary | ICD-10-CM | POA: Diagnosis not present

## 2019-10-09 DIAGNOSIS — U071 COVID-19: Secondary | ICD-10-CM | POA: Diagnosis not present

## 2019-10-09 DIAGNOSIS — R0981 Nasal congestion: Secondary | ICD-10-CM | POA: Diagnosis not present

## 2019-12-18 DIAGNOSIS — Z3202 Encounter for pregnancy test, result negative: Secondary | ICD-10-CM | POA: Diagnosis not present

## 2019-12-18 DIAGNOSIS — R102 Pelvic and perineal pain: Secondary | ICD-10-CM | POA: Diagnosis not present

## 2019-12-24 ENCOUNTER — Other Ambulatory Visit (HOSPITAL_COMMUNITY): Payer: Self-pay | Admitting: Obstetrics and Gynecology

## 2019-12-24 DIAGNOSIS — N3281 Overactive bladder: Secondary | ICD-10-CM | POA: Diagnosis not present

## 2019-12-24 DIAGNOSIS — R102 Pelvic and perineal pain: Secondary | ICD-10-CM | POA: Diagnosis not present

## 2019-12-31 MED FILL — traMADol HCL 50 MG TABS: 50 | 30 days supply | Qty: 30 | Fill #0

## 2020-04-09 ENCOUNTER — Other Ambulatory Visit (HOSPITAL_COMMUNITY): Payer: Self-pay | Admitting: Radiology

## 2020-04-09 DIAGNOSIS — Z3040 Encounter for surveillance of contraceptives, unspecified: Secondary | ICD-10-CM | POA: Diagnosis not present

## 2020-04-09 DIAGNOSIS — Z6823 Body mass index (BMI) 23.0-23.9, adult: Secondary | ICD-10-CM | POA: Diagnosis not present

## 2020-04-09 DIAGNOSIS — Z01419 Encounter for gynecological examination (general) (routine) without abnormal findings: Secondary | ICD-10-CM | POA: Diagnosis not present

## 2020-04-09 DIAGNOSIS — N3281 Overactive bladder: Secondary | ICD-10-CM | POA: Diagnosis not present

## 2020-05-04 ENCOUNTER — Other Ambulatory Visit (HOSPITAL_COMMUNITY): Payer: Self-pay | Admitting: Family Medicine

## 2020-05-04 DIAGNOSIS — R058 Other specified cough: Secondary | ICD-10-CM | POA: Diagnosis not present

## 2020-05-04 DIAGNOSIS — J4521 Mild intermittent asthma with (acute) exacerbation: Secondary | ICD-10-CM | POA: Diagnosis not present

## 2020-05-04 MED FILL — AZITHROMYCIN 250 MG TABLET: 250 | 5 days supply | Qty: 6 | Fill #0

## 2020-05-04 MED FILL — predniSONE 20 MG TABS: 20 | 5 days supply | Qty: 10 | Fill #0

## 2020-08-05 ENCOUNTER — Other Ambulatory Visit (HOSPITAL_COMMUNITY): Payer: Self-pay

## 2020-08-05 MED ORDER — TRAMADOL HCL 50 MG PO TABS
50.0000 mg | ORAL_TABLET | Freq: Two times a day (BID) | ORAL | 2 refills | Status: AC | PRN
  Filled 2020-08-05: qty 30, 15d supply, fill #0

## 2020-08-16 ENCOUNTER — Other Ambulatory Visit (HOSPITAL_COMMUNITY): Payer: Self-pay

## 2020-08-16 MED ORDER — NITROFURANTOIN MONOHYD MACRO 100 MG PO CAPS
100.0000 mg | ORAL_CAPSULE | Freq: Two times a day (BID) | ORAL | 0 refills | Status: AC
  Filled 2020-08-16: qty 14, 7d supply, fill #0

## 2020-10-14 ENCOUNTER — Other Ambulatory Visit: Payer: Self-pay | Admitting: Family Medicine

## 2020-10-14 DIAGNOSIS — G4452 New daily persistent headache (NDPH): Secondary | ICD-10-CM

## 2020-10-14 DIAGNOSIS — R279 Unspecified lack of coordination: Secondary | ICD-10-CM | POA: Diagnosis not present

## 2020-10-14 DIAGNOSIS — R2689 Other abnormalities of gait and mobility: Secondary | ICD-10-CM | POA: Diagnosis not present

## 2020-10-14 DIAGNOSIS — D500 Iron deficiency anemia secondary to blood loss (chronic): Secondary | ICD-10-CM | POA: Diagnosis not present

## 2020-10-14 DIAGNOSIS — R5383 Other fatigue: Secondary | ICD-10-CM | POA: Diagnosis not present

## 2020-10-16 ENCOUNTER — Ambulatory Visit (HOSPITAL_COMMUNITY)
Admission: RE | Admit: 2020-10-16 | Discharge: 2020-10-16 | Disposition: A | Discharge status: Home / Self Care | Payer: 59 | Source: Ambulatory Visit | Attending: Family Medicine | Admitting: Family Medicine

## 2020-10-16 ENCOUNTER — Other Ambulatory Visit: Payer: Self-pay

## 2020-10-16 DIAGNOSIS — R2689 Other abnormalities of gait and mobility: Secondary | ICD-10-CM | POA: Diagnosis not present

## 2020-10-16 DIAGNOSIS — R519 Headache, unspecified: Secondary | ICD-10-CM | POA: Diagnosis not present

## 2020-10-16 DIAGNOSIS — G4452 New daily persistent headache (NDPH): Secondary | ICD-10-CM

## 2020-10-16 DIAGNOSIS — R279 Unspecified lack of coordination: Secondary | ICD-10-CM

## 2020-10-16 MED ORDER — GADOBUTROL 1 MMOL/ML IV SOLN
6.4000 mL | Freq: Once | INTRAVENOUS | Status: AC | PRN
  Administered 2020-10-16: 6.4 mL via INTRAVENOUS
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
## 2020-10-25 ENCOUNTER — Ambulatory Visit (INDEPENDENT_AMBULATORY_CARE_PROVIDER_SITE_OTHER): Payer: 59 | Admitting: Psychiatry

## 2020-10-25 ENCOUNTER — Encounter: Payer: Self-pay | Admitting: Psychiatry

## 2020-10-25 ENCOUNTER — Other Ambulatory Visit (HOSPITAL_COMMUNITY): Payer: Self-pay

## 2020-10-25 VITALS — BP 111/62 | HR 74 | Ht 68.0 in | Wt 153.0 lb

## 2020-10-25 DIAGNOSIS — G4452 New daily persistent headache (NDPH): Secondary | ICD-10-CM

## 2020-10-25 MED ORDER — AMITRIPTYLINE HCL 10 MG PO TABS
ORAL_TABLET | ORAL | 3 refills | Status: AC
  Filled 2020-10-25: qty 30, 18d supply, fill #0

## 2020-10-25 NOTE — Patient Instructions (Signed)
Start amitriptyline 10 mg at bedtime for one week, then increase to 20 mg at bedtime. It may take up to 6-8 weeks to feel the full effect of the medication  Your headache is consistent with new daily persistent headache (NDPH). This is a headache that begins one day and continues daily for at least 3 months. People with NDPH can often recall the exact day when the pain began. About 50% of those with NDPH will have associated migrainous features including throbbing pain, sensitivity to light, sensitivity to sound, or nausea. This can occur at any age, and is 2-3 times more common in women. It is sometimes preceded by a flu-like illness or a cold, a stressful life event, or a surgery. However, there is no clear inciting event in at least 50% of NDPH cases. Imaging of the brain should be done to rule out secondary causes of the headache, but imaging will be normal in most cases of NDPH.  Often people with NDPH will have a component of medication overuse headache from overuse of over the counter pain medications like ibuprofen or tylenol. We recommend limiting the use of over the counter medications to less than 2 days per week or 10 days per month. Using these medications more frequently can contribute to the headache and even make it worse.  We use daily preventative medications to treat NDPH. Lifestyle changes such as maintaining regular sleep, exercise, and a good diet can also help reduce headaches. Other non-medication options include behavioral therapy and biofeedback.

## 2020-10-28 DIAGNOSIS — G43709 Chronic migraine without aura, not intractable, without status migrainosus: Secondary | ICD-10-CM | POA: Diagnosis not present

## 2020-11-02 ENCOUNTER — Other Ambulatory Visit (HOSPITAL_COMMUNITY): Payer: Self-pay

## 2020-11-04 DIAGNOSIS — G43709 Chronic migraine without aura, not intractable, without status migrainosus: Secondary | ICD-10-CM | POA: Diagnosis not present

## 2020-11-22 ENCOUNTER — Telehealth: Payer: Self-pay | Admitting: Psychiatry

## 2020-11-22 MED ORDER — RIZATRIPTAN BENZOATE 10 MG PO TABS: 10.0000 mg | ORAL_TABLET | ORAL | 2 refills | Status: AC | PRN

## 2020-11-22 NOTE — Telephone Encounter (Signed)
Pt called stating she has been experiencing a severe headache for the past week, pt wants to know what she should do. Pt requesting a call back.

## 2020-11-22 NOTE — Telephone Encounter (Signed)
Called patient and advised her of Dr Georgina Peer new Rx and instructions for taking it. She had no questions, verbalized understanding, appreciation.

## 2020-11-22 NOTE — Telephone Encounter (Signed)
Prescription for Maxalt sent to [facility]. She can take this as needed for a bad headache and may take a second dose in 2 hours if headache persists. She should limit use of Maxalt to 2 days per week.

## 2020-11-22 NOTE — Telephone Encounter (Signed)
Called patient who stated she's not started amitriptyline, would rather have medication to take as needed because her headaches are less frequent. Her headache began over weekend, continues today.  She took Ibuprofen 600 mg without relief but got relief with  zofran and tramadol. She is requesting medicine to take acutely, not daily. I advised will send her request to Dr Billey Gosling and let her know. Patient verbalized understanding, appreciation.

## 2021-01-04 ENCOUNTER — Telehealth: Payer: Self-pay | Admitting: Psychiatry

## 2021-01-04 NOTE — Telephone Encounter (Signed)
R/s 12/12 appt due to MD being out of office, LVM and sent mychart msg informing pt.

## 2021-01-17 ENCOUNTER — Ambulatory Visit: Payer: 59 | Admitting: Psychiatry

## 2021-01-20 ENCOUNTER — Ambulatory Visit: Payer: 59 | Admitting: Psychiatry

## 2021-02-11 ENCOUNTER — Other Ambulatory Visit (HOSPITAL_COMMUNITY): Payer: Self-pay

## 2021-02-11 DIAGNOSIS — B3731 Acute candidiasis of vulva and vagina: Secondary | ICD-10-CM | POA: Diagnosis not present

## 2021-02-11 DIAGNOSIS — N760 Acute vaginitis: Secondary | ICD-10-CM | POA: Diagnosis not present

## 2021-02-11 DIAGNOSIS — R102 Pelvic and perineal pain: Secondary | ICD-10-CM | POA: Diagnosis not present

## 2021-02-11 MED ORDER — FLUCONAZOLE 150 MG PO TABS
150.0000 mg | ORAL_TABLET | Freq: Every day | ORAL | 0 refills | Status: DC
  Filled 2021-02-11: qty 2, 2d supply, fill #0
  Filled 2021-02-15: qty 2, 4d supply, fill #0

## 2021-02-11 MED ORDER — METRONIDAZOLE 500 MG PO TABS
500.0000 mg | ORAL_TABLET | Freq: Three times a day (TID) | ORAL | 0 refills | Status: DC
  Filled 2021-02-11: qty 21, 7d supply, fill #0

## 2021-02-14 ENCOUNTER — Other Ambulatory Visit (HOSPITAL_COMMUNITY): Payer: Self-pay

## 2021-02-14 DIAGNOSIS — R102 Pelvic and perineal pain: Secondary | ICD-10-CM | POA: Diagnosis not present

## 2021-02-14 MED ORDER — TRAMADOL HCL 50 MG PO TABS
50.0000 mg | ORAL_TABLET | Freq: Two times a day (BID) | ORAL | 2 refills | Status: AC | PRN
  Filled 2021-02-14: qty 30, 15d supply, fill #0

## 2021-02-15 ENCOUNTER — Other Ambulatory Visit (HOSPITAL_COMMUNITY): Payer: Self-pay

## 2021-05-04 DIAGNOSIS — R102 Pelvic and perineal pain: Secondary | ICD-10-CM | POA: Diagnosis not present

## 2021-05-04 DIAGNOSIS — R1032 Left lower quadrant pain: Secondary | ICD-10-CM | POA: Diagnosis not present

## 2021-06-15 DIAGNOSIS — R102 Pelvic and perineal pain: Secondary | ICD-10-CM | POA: Diagnosis not present

## 2021-06-15 DIAGNOSIS — N80102 Endometriosis of left ovary, unspecified depth: Secondary | ICD-10-CM | POA: Diagnosis not present

## 2021-06-15 DIAGNOSIS — R1032 Left lower quadrant pain: Secondary | ICD-10-CM | POA: Diagnosis not present

## 2021-07-22 ENCOUNTER — Telehealth: Payer: 59 | Admitting: Physician Assistant

## 2021-07-22 DIAGNOSIS — J019 Acute sinusitis, unspecified: Secondary | ICD-10-CM

## 2021-07-22 DIAGNOSIS — B9689 Other specified bacterial agents as the cause of diseases classified elsewhere: Secondary | ICD-10-CM

## 2021-07-22 MED ORDER — AMOXICILLIN-POT CLAVULANATE 875-125 MG PO TABS: 1.0000 | ORAL_TABLET | Freq: Two times a day (BID) | ORAL | 0 refills | Status: DC

## 2021-07-22 NOTE — Progress Notes (Signed)

## 2021-07-26 DIAGNOSIS — K5900 Constipation, unspecified: Secondary | ICD-10-CM | POA: Diagnosis not present

## 2021-07-26 DIAGNOSIS — N393 Stress incontinence (female) (male): Secondary | ICD-10-CM | POA: Diagnosis not present

## 2021-08-02 DIAGNOSIS — N393 Stress incontinence (female) (male): Secondary | ICD-10-CM | POA: Diagnosis not present

## 2021-08-02 DIAGNOSIS — K5900 Constipation, unspecified: Secondary | ICD-10-CM | POA: Diagnosis not present

## 2021-08-16 DIAGNOSIS — K5900 Constipation, unspecified: Secondary | ICD-10-CM | POA: Diagnosis not present

## 2021-08-16 DIAGNOSIS — N393 Stress incontinence (female) (male): Secondary | ICD-10-CM | POA: Diagnosis not present

## 2021-09-14 DIAGNOSIS — N898 Other specified noninflammatory disorders of vagina: Secondary | ICD-10-CM | POA: Diagnosis not present

## 2021-09-14 DIAGNOSIS — N760 Acute vaginitis: Secondary | ICD-10-CM | POA: Diagnosis not present

## 2021-11-30 ENCOUNTER — Other Ambulatory Visit (HOSPITAL_COMMUNITY): Payer: Self-pay

## 2021-11-30 DIAGNOSIS — Z124 Encounter for screening for malignant neoplasm of cervix: Secondary | ICD-10-CM | POA: Diagnosis not present

## 2021-11-30 DIAGNOSIS — Z01419 Encounter for gynecological examination (general) (routine) without abnormal findings: Secondary | ICD-10-CM | POA: Diagnosis not present

## 2021-11-30 DIAGNOSIS — Z6824 Body mass index (BMI) 24.0-24.9, adult: Secondary | ICD-10-CM | POA: Diagnosis not present

## 2021-11-30 DIAGNOSIS — N8003 Adenomyosis of the uterus: Secondary | ICD-10-CM | POA: Diagnosis not present

## 2021-11-30 MED ORDER — METHOCARBAMOL 750 MG PO TABS
750.0000 mg | ORAL_TABLET | Freq: Three times a day (TID) | ORAL | 3 refills | Status: AC | PRN
  Filled 2021-11-30: qty 20, 7d supply, fill #0
  Filled 2021-11-30: qty 20, 20d supply, fill #0

## 2021-12-01 ENCOUNTER — Other Ambulatory Visit (HOSPITAL_COMMUNITY): Payer: Self-pay

## 2021-12-07 ENCOUNTER — Other Ambulatory Visit (HOSPITAL_COMMUNITY): Payer: Self-pay

## 2021-12-07 DIAGNOSIS — J301 Allergic rhinitis due to pollen: Secondary | ICD-10-CM | POA: Diagnosis not present

## 2021-12-07 DIAGNOSIS — H1045 Other chronic allergic conjunctivitis: Secondary | ICD-10-CM | POA: Diagnosis not present

## 2021-12-07 DIAGNOSIS — J3089 Other allergic rhinitis: Secondary | ICD-10-CM | POA: Diagnosis not present

## 2021-12-07 MED ORDER — AZELASTINE HCL 0.05 % OP SOLN
1.0000 [drp] | Freq: Two times a day (BID) | OPHTHALMIC | 3 refills | Status: AC
  Filled 2021-12-07: qty 6, 60d supply, fill #0

## 2021-12-07 MED ORDER — AZELASTINE HCL 0.1 % NA SOLN
1.0000 | Freq: Two times a day (BID) | NASAL | 3 refills | Status: AC
  Filled 2021-12-07: qty 30, 25d supply, fill #0

## 2022-01-08 ENCOUNTER — Telehealth: Payer: 59 | Admitting: Family

## 2022-01-08 DIAGNOSIS — J019 Acute sinusitis, unspecified: Secondary | ICD-10-CM | POA: Diagnosis not present

## 2022-01-08 MED ORDER — DOXYCYCLINE HYCLATE 100 MG PO TABS
100.0000 mg | ORAL_TABLET | Freq: Two times a day (BID) | ORAL | 0 refills | Status: DC
  Filled 2022-01-08: qty 20, 10d supply, fill #0

## 2022-01-08 NOTE — Progress Notes (Signed)

## 2022-01-09 ENCOUNTER — Other Ambulatory Visit (HOSPITAL_COMMUNITY): Payer: Self-pay

## 2022-03-13 ENCOUNTER — Other Ambulatory Visit (HOSPITAL_COMMUNITY): Payer: Self-pay

## 2022-03-13 ENCOUNTER — Telehealth: Payer: Self-pay | Admitting: Urgent Care

## 2022-03-13 DIAGNOSIS — N3000 Acute cystitis without hematuria: Secondary | ICD-10-CM

## 2022-03-13 MED ORDER — NITROFURANTOIN MONOHYD MACRO 100 MG PO CAPS
100.0000 mg | ORAL_CAPSULE | Freq: Two times a day (BID) | ORAL | 0 refills | Status: AC
  Filled 2022-03-13: qty 10, 5d supply, fill #0

## 2022-03-13 NOTE — Progress Notes (Signed)
E-Visit for Urinary Problems  We are sorry that you are not feeling well.  Here is how we plan to help!  Based on what you shared with me it looks like you most likely have a simple urinary tract infection.  A UTI (Urinary Tract Infection) is a bacterial infection of the bladder.  Most cases of urinary tract infections are simple to treat but a key part of your care is to encourage you to drink plenty of fluids and watch your symptoms carefully.  I have prescribed MacroBid 100 mg twice a day for 5 days.  Your symptoms should gradually improve. Call us if the burning in your urine worsens, you develop worsening fever, back pain or pelvic pain or if your symptoms do not resolve after completing the antibiotic.  Urinary tract infections can be prevented by drinking plenty of water to keep your body hydrated.  Also be sure when you wipe, wipe from front to back and don't hold it in!  If possible, empty your bladder every 4 hours.  HOME CARE Drink plenty of fluids Compete the full course of the antibiotics even if the symptoms resolve Remember, when you need to go.go. Holding in your urine can increase the likelihood of getting a UTI! GET HELP RIGHT AWAY IF: You cannot urinate You get a high fever Worsening back pain occurs You see blood in your urine You feel sick to your stomach or throw up You feel like you are going to pass out  MAKE SURE YOU  Understand these instructions. Will watch your condition. Will get help right away if you are not doing well or get worse.   Thank you for choosing an e-visit.  Your e-visit answers were reviewed by a board certified advanced clinical practitioner to complete your personal care plan. Depending upon the condition, your plan could have included both over the counter or prescription medications.  Please review your pharmacy choice. Make sure the pharmacy is open so you can pick up prescription now. If there is a problem, you may contact your  provider through [employer] and have the prescription routed to another pharmacy.  Your safety is important to [place]. If you have drug allergies check your prescription carefully.   For the next 24 hours you can use MyChart to ask questions about today's visit, request a non-urgent call back, or ask for a work or school excuse. You will get an email in the next two days asking about your experience. I hope that your e-visit has been valuable and will speed your recovery.    I have spent 5 minutes in review of e-visit questionnaire, review and updating patient chart, medical decision making and response to patient.   [place], [place]

## 2022-07-16 ENCOUNTER — Encounter (HOSPITAL_COMMUNITY): Payer: Self-pay | Admitting: Pharmacy Technician

## 2022-07-16 ENCOUNTER — Telehealth: Payer: Commercial Managed Care - PPO | Admitting: Family

## 2022-07-16 ENCOUNTER — Emergency Department (HOSPITAL_COMMUNITY)
Admission: EM | Admit: 2022-07-16 | Discharge: 2022-07-16 | Disposition: A | Discharge status: Home / Self Care | Payer: Commercial Managed Care - PPO | Attending: Emergency Medicine | Admitting: Emergency Medicine

## 2022-07-16 DIAGNOSIS — L03031 Cellulitis of right toe: Secondary | ICD-10-CM | POA: Diagnosis not present

## 2022-07-16 DIAGNOSIS — L0390 Cellulitis, unspecified: Secondary | ICD-10-CM

## 2022-07-16 DIAGNOSIS — M79674 Pain in right toe(s): Secondary | ICD-10-CM | POA: Diagnosis not present

## 2022-07-16 DIAGNOSIS — L03115 Cellulitis of right lower limb: Secondary | ICD-10-CM | POA: Diagnosis not present

## 2022-07-16 LAB — I-STAT BETA HCG BLOOD, ED (MC, WL, AP ONLY): I-stat hCG, quantitative: <5 m[IU]/mL (ref <5)

## 2022-07-16 LAB — CBC WITH DIFFERENTIAL/PLATELET
Abs Immature Granulocytes: 0.02 10*3/uL (ref 0.00–0.07)
Basophils Absolute: 0 10*3/uL (ref 0.0–0.1)
Basophils Relative: 1 %
Eosinophils Absolute: 0.2 10*3/uL (ref 0.0–0.5)
Eosinophils Relative: 4 %
HCT: 36.3 % (ref 36.0–46.0)
Hemoglobin: 12.3 g/dL (ref 12.0–15.0)
Immature Granulocytes: 0 %
Lymphocytes Relative: 22 %
Lymphs Abs: 1.2 10*3/uL (ref 0.7–4.0)
MCH: 33.8 pg (ref 26.0–34.0)
MCHC: 33.9 g/dL (ref 30.0–36.0)
MCV: 99.7 fL (ref 80.0–100.0)
Monocytes Absolute: 0.3 10*3/uL (ref 0.1–1.0)
Monocytes Relative: 6 %
Neutro Abs: 3.9 10*3/uL (ref 1.7–7.7)
Neutrophils Relative %: 67 %
Platelets: 225 10*3/uL (ref 150–400)
RBC: 3.64 MIL/uL — ABNORMAL LOW (ref 3.87–5.11)
RDW: 11.9 % (ref 11.5–15.5)
WBC: 5.7 10*3/uL (ref 4.0–10.5)
nRBC: 0 % (ref 0.0–0.2)

## 2022-07-16 LAB — I-STAT CHEM 8, ED
BUN: 17 mg/dL (ref 6–20)
Calcium, Ion: 1.13 mmol/L — ABNORMAL LOW (ref 1.15–1.40)
Chloride: 104 mmol/L (ref 98–111)
Creatinine, Ser: 1 mg/dL (ref 0.44–1.00)
Glucose, Bld: 125 mg/dL — ABNORMAL HIGH (ref 70–99)
HCT: 35 % — ABNORMAL LOW (ref 36.0–46.0)
Hemoglobin: 11.9 g/dL — ABNORMAL LOW (ref 12.0–15.0)
Potassium: 3.6 mmol/L (ref 3.5–5.1)
Sodium: 140 mmol/L (ref 135–145)
TCO2: 26 mmol/L (ref 22–32)

## 2022-07-16 MED ORDER — METRONIDAZOLE 500 MG PO TABS: 500.0000 mg | ORAL_TABLET | Freq: Two times a day (BID) | ORAL | 0 refills | Status: DC

## 2022-07-16 MED ORDER — DIPHENHYDRAMINE HCL 50 MG/ML IJ SOLN
50.0000 mg | Freq: Once | INTRAMUSCULAR | Status: AC
  Administered 2022-07-16: 50 mg via INTRAVENOUS
  Filled 2022-07-16: qty 1

## 2022-07-16 MED ORDER — VANCOMYCIN HCL 1.25 G IV SOLR
1250.0000 mg | Freq: Once | INTRAVENOUS | Status: AC
  Administered 2022-07-16: 1250 mg via INTRAVENOUS
  Filled 2022-07-16: qty 25

## 2022-07-16 MED ORDER — SULFAMETHOXAZOLE-TRIMETHOPRIM 800-160 MG PO TABS: 1.0000 | ORAL_TABLET | Freq: Two times a day (BID) | ORAL | 0 refills | Status: DC

## 2022-07-16 MED ORDER — FLUCONAZOLE 150 MG PO TABS: 150.0000 mg | ORAL_TABLET | Freq: Every day | ORAL | 0 refills | Status: AC

## 2022-07-16 NOTE — ED Provider Notes (Signed)
[place] [facility] AT [facility] Provider Note   CSN: [id number] Arrival date & time: 07/16/22  1520     History  No chief complaint on file.   Yesenia Baker is a 37 y.o. female.  37 year old female with prior medical history as detailed below presents for evaluation.   Patient with complaint of blister to right fifth toe.  She reports minimal erythema around this blistering over the last 24 hours.  This morning she had a tele-visit and was prescribed Bactrim which she has yet to fill for suspected early cellulitis.  Over the course of the morning she noticed some streaking along the dorsum of the right foot to the ankle.  She denies fevers.  She denies other complaint.  Notably, patient is leaving today for a trip to [place].  She does not plan on returning home until the 18th.  The history is provided by the patient and medical records.       Home Medications Prior to Admission medications   Medication Sig Start Date End Date Taking? Authorizing Provider  amitriptyline (ELAVIL) 10 MG tablet Take 1 tablet (10 mg total) by mouth at bedtime for 7 days, THEN 2 tablets (20 mg total) at bedtime 10/25/20 11/19/20  Ocie Doyne, MD  azelastine (ASTELIN) 0.1 % nasal spray Place 1-2 sprays into both nostrils 2 (two) times daily 12/07/21     azelastine (OPTIVAR) 0.05 % ophthalmic solution Place 1 drop into affected eye(s) 2 (two) times daily 12/07/21     Cetirizine HCl (ZYRTEC ALLERGY) 10 MG CAPS     [provider]  fluticasone (FLONASE) 50 MCG/ACT nasal spray Place into both nostrils daily.    [provider]  Magnesium Salicylate 325 MG TABS Take by mouth.    [provider]  methocarbamol (ROBAXIN-750) 750 MG tablet Take 1 tablet (750 mg total) by mouth 3 (three) times daily as needed. 11/30/21     Prenatal Vit-Fe Fumarate-FA (PRENATAL MULTIVITAMIN) TABS Take 1 tablet by mouth daily at 12 noon.    [provider]   rizatriptan (MAXALT) 10 MG tablet Take 1 tablet (10 mg total) by mouth as needed for migraine. May repeat in 2 hours if needed 11/22/20   Ocie Doyne, MD  sulfamethoxazole-trimethoprim (BACTRIM DS) 800-160 MG tablet Take 1 tablet by mouth 2 (two) times daily. 07/16/22   Junie Spencer, FNP  traMADol (ULTRAM) 50 MG tablet Take 1 tablet (50 mg total) by mouth 2 (two) times daily as needed. 08/05/20     traMADol (ULTRAM) 50 MG tablet Take 1 tablet (50 mg total) by mouth 2 (two) times daily as needed. 02/14/21         Allergies    Ceclor [cefaclor]    Review of Systems   Review of Systems  All other systems reviewed and are negative.   Physical Exam Updated Vital Signs BP (!) 131/103 (BP Location: Right Arm)   Pulse 82   Temp 98.8 F (37.1 C)   Resp 14   SpO2 100%  Physical Exam Vitals and nursing note reviewed.  Constitutional:      General: She is not in acute distress.    Appearance: Normal appearance. She is well-developed.  HENT:     Head: Normocephalic and atraumatic.  Eyes:     Conjunctiva/sclera: Conjunctivae normal.     Pupils: Pupils are equal, round, and reactive to light.  Cardiovascular:     Rate and Rhythm: Normal rate and regular rhythm.  Heart sounds: Normal heart sounds.  Pulmonary:     Effort: Pulmonary effort is normal. No respiratory distress.     Breath sounds: Normal breath sounds.  Abdominal:     General: There is no distension.     Palpations: Abdomen is soft.     Tenderness: There is no abdominal tenderness.  Musculoskeletal:        General: No deformity. Normal range of motion.     Cervical back: Normal range of motion and neck supple.  Skin:    General: Skin is warm and dry.  Neurological:     General: No focal deficit present.     Mental Status: She is alert and oriented to person, place, and time.     ED Results / Procedures / Treatments   Labs (all labs ordered are listed, but only abnormal results are displayed) Labs Reviewed   CULTURE, BLOOD (ROUTINE X 2)  CULTURE, BLOOD (ROUTINE X 2)  CBC WITH DIFFERENTIAL/PLATELET  I-STAT CHEM 8, ED  I-STAT BETA HCG BLOOD, ED ([facility], [facility], [facility] ONLY)    EKG None  Radiology No results found.  Procedures Procedures    Medications Ordered in ED Medications  Vancomycin (VANCOCIN) 1,250 mg in sodium chloride 0.9 % 250 mL IVPB (has no administration in time range)    ED Course/ Medical Decision Making/ A&P                             Medical Decision Making Amount and/or Complexity of Data Reviewed Labs: ordered.  Risk Prescription drug management.    Medical Screen Complete  This patient presented to the ED with complaint of right foot cellulitis.  This complaint involves an extensive number of treatment options. The initial differential diagnosis includes, but is not limited to, right foot cellulitis  This presentation is: Acute, Self-Limited, Previously Undiagnosed, Uncertain Prognosis, Complicated, Systemic Symptoms, and Threat to Life/Bodily Function  Patient is presenting with right foot cellulitis.  Patient with infected blister to right fifth digit.  Streaking noticed along the dorsum of the right foot.  Patient with evidence of systemic illness such as fever or elevated white count.  Initial dose of IV vancomycin administered here in the ED.  Patient has already received prescription for Bactrim which she will start this afternoon.  Given additional coverage with Flagyl.  Patient is leaving for vacation tomorrow morning.  She is apparently traveling to [place] and [place].  Patient is advised that if her infection does not improve significantly over the next 48 hours she should seek adequate attention.  Patient is an [employer] fund[employer].  She is quite comfortable with making medical decisions.  Importance of close follow-up is stressed.  Strict return precautions given and understood.  Additional history obtained: External records from outside sources obtained and  reviewed including prior ED visits and prior Inpatient records.    Lab Tests:  I ordered and personally interpreted labs.   Medicines ordered:  I ordered medication including Vancomycin  for cellulitis  Reevaluation of the patient after these medicines showed that the patient: stayed the same    Problem List / ED Course:  Right foot cellulitis   Reevaluation:  After the interventions noted above, I reevaluated the patient and found that they have: stayed the same   Disposition:  After consideration of the diagnostic results and the patients response to treatment, I feel that the patent would benefit from close outpatient followup.  Final Clinical Impression(s) / ED Diagnoses Final diagnoses:  Cellulitis, unspecified cellulitis site    Rx / DC Orders ED Discharge Orders          Ordered    metroNIDAZOLE (FLAGYL) 500 MG tablet  2 times daily        07/16/22 1553    fluconazole (DIFLUCAN) 150 MG tablet  Daily        07/16/22 1634              Wynetta Fines, MD 07/16/22 2121

## 2022-07-16 NOTE — ED Notes (Signed)
Patient Alert and oriented to baseline. Stable and ambulatory to baseline. Patient verbalized understanding of the discharge instructions.  Patient belongings were taken by the patient.   

## 2022-07-16 NOTE — Progress Notes (Signed)
E Visit for Cellulitis ° °We are sorry that you are not feeling well. Here is how we plan to help! ° °Based on what you shared with me it looks like you have cellulitis.  Cellulitis looks like areas of skin redness, swelling, and warmth; it develops as a result of bacteria entering under the skin. Little red spots and/or bleeding can be seen in skin, and tiny surface sacs containing fluid can occur. Fever can be present. Cellulitis is almost always on one side of a body, and the lower limbs are the most common site of involvement.  ° °I have prescribed:  Bactrim DS 1 tablet by mouth twice a day for 7 days. ° °HOME CARE: ° °Take your medications as ordered and take all of them, even if the skin irritation appears to be healing.  ° °GET HELP RIGHT AWAY IF: ° °Symptoms that don't begin to go away within 48 hours. °Severe redness persists or worsens °If the area turns color, spreads or swells. °If it blisters and opens, develops yellow-brown crust or bleeds. °You develop a fever or chills. °If the pain increases or becomes unbearable.  °Are unable to keep fluids and food down. ° °MAKE SURE YOU  ° °Understand these instructions. °Will watch your condition. °Will get help right away if you are not doing well or get worse. ° °Thank you for choosing an e-visit. ° °Your e-visit answers were reviewed by a board certified advanced clinical practitioner to complete your personal care plan. Depending upon the condition, your plan could have included both over the counter or prescription medications. ° °Please review your pharmacy choice. Make sure the pharmacy is open so you can pick up prescription now. If there is a problem, you may contact your provider through MyChart messaging and have the prescription routed to another pharmacy.  Your safety is important to us. If you have drug allergies check your prescription carefully.  ° °For the next 24 hours you can use MyChart to ask questions about today's visit, request a  non-urgent call back, or ask for a work or school excuse. °You will get an email in the next two days asking about your experience. I hope that your e-visit has been valuable and will speed your recovery. ° °Approximately 5 minutes was spent documenting and reviewing patient's chart.  ° ° °

## 2022-07-16 NOTE — ED Triage Notes (Signed)
Pt here with infection to R foot with red streak extending up the foot and to the middle of the shin.

## 2022-07-16 NOTE — Discharge Instructions (Addendum)
Return for any problem.  Take Bactrim as previously prescribed.  Take Flagyl as prescribed today.  Do not drink alcohol and take Flagyl concurrently.  If you notice fever, increased redness, increased streaking seek medical attention.

## 2022-07-18 LAB — CULTURE, BLOOD (ROUTINE X 2): Special Requests: ADEQUATE

## 2022-07-20 LAB — CULTURE, BLOOD (ROUTINE X 2): Culture: NO GROWTH

## 2022-07-21 LAB — CULTURE, BLOOD (ROUTINE X 2): Culture: NO GROWTH

## 2022-08-01 ENCOUNTER — Other Ambulatory Visit (HOSPITAL_COMMUNITY): Payer: Self-pay

## 2022-08-01 MED ORDER — FLUCONAZOLE 150 MG PO TABS
150.0000 mg | ORAL_TABLET | ORAL | 0 refills | Status: DC
  Filled 2022-08-01: qty 2, 6d supply, fill #0

## 2022-08-02 DIAGNOSIS — Z20818 Contact with and (suspected) exposure to other bacterial communicable diseases: Secondary | ICD-10-CM | POA: Diagnosis not present

## 2022-08-02 DIAGNOSIS — J029 Acute pharyngitis, unspecified: Secondary | ICD-10-CM | POA: Diagnosis not present

## 2022-08-31 ENCOUNTER — Other Ambulatory Visit (HOSPITAL_COMMUNITY): Payer: Self-pay

## 2022-08-31 DIAGNOSIS — F419 Anxiety disorder, unspecified: Secondary | ICD-10-CM | POA: Diagnosis not present

## 2022-08-31 MED ORDER — METHOCARBAMOL 750 MG PO TABS
750.0000 mg | ORAL_TABLET | ORAL | 3 refills | Status: AC | PRN
  Filled 2022-08-31: qty 30, 30d supply, fill #0
  Filled 2023-03-29: qty 30, 30d supply, fill #1

## 2022-08-31 MED ORDER — TRAMADOL HCL 50 MG PO TABS
50.0000 mg | ORAL_TABLET | ORAL | 0 refills | Status: AC | PRN
  Filled 2022-08-31: qty 30, 30d supply, fill #0

## 2022-08-31 MED ORDER — BUSPIRONE HCL 7.5 MG PO TABS
7.5000 mg | ORAL_TABLET | Freq: Two times a day (BID) | ORAL | 1 refills | Status: AC
  Filled 2022-08-31: qty 60, 30d supply, fill #0

## 2022-09-06 ENCOUNTER — Other Ambulatory Visit (HOSPITAL_COMMUNITY): Payer: Self-pay

## 2022-09-06 MED ORDER — ALBUTEROL SULFATE HFA 108 (90 BASE) MCG/ACT IN AERS
2.0000 | INHALATION_SPRAY | RESPIRATORY_TRACT | 0 refills | Status: AC | PRN
  Filled 2022-09-06: qty 6.7, 25d supply, fill #0

## 2022-09-11 ENCOUNTER — Other Ambulatory Visit (HOSPITAL_COMMUNITY): Payer: Self-pay

## 2022-09-11 MED ORDER — FLUCONAZOLE 150 MG PO TABS
150.0000 mg | ORAL_TABLET | Freq: Every day | ORAL | 0 refills | Status: DC
  Filled 2022-09-11: qty 2, 2d supply, fill #0

## 2022-12-02 ENCOUNTER — Telehealth: Payer: Commercial Managed Care - PPO | Admitting: Family Medicine

## 2022-12-02 DIAGNOSIS — M79674 Pain in right toe(s): Secondary | ICD-10-CM | POA: Diagnosis not present

## 2022-12-02 DIAGNOSIS — N390 Urinary tract infection, site not specified: Secondary | ICD-10-CM | POA: Diagnosis not present

## 2022-12-02 MED ORDER — SULFAMETHOXAZOLE-TRIMETHOPRIM 800-160 MG PO TABS
1.0000 | ORAL_TABLET | Freq: Two times a day (BID) | ORAL | 0 refills | Status: DC
Start: 2022-12-02 — End: 2023-03-20

## 2022-12-02 NOTE — Progress Notes (Signed)
E-Visit for Urinary Problems  We are sorry that you are not feeling well.  Here is how we plan to help!  Based on what you shared with me it looks like you most likely have a simple urinary tract infection.  A UTI (Urinary Tract Infection) is a bacterial infection of the bladder.  Most cases of urinary tract infections are simple to treat but a key part of your care is to encourage you to drink plenty of fluids and watch your symptoms carefully.  I have prescribed Bactrim DS One tablet twice a day for 5 days.  Your symptoms should gradually improve. Call us if the burning in your urine worsens, you develop worsening fever, back pain or pelvic pain or if your symptoms do not resolve after completing the antibiotic.  Urinary tract infections can be prevented by drinking plenty of water to keep your body hydrated.  Also be sure when you wipe, wipe from front to back and don't hold it in!  If possible, empty your bladder every 4 hours.  HOME CARE Drink plenty of fluids Compete the full course of the antibiotics even if the symptoms resolve Remember, when you need to go.go. Holding in your urine can increase the likelihood of getting a UTI! GET HELP RIGHT AWAY IF: You cannot urinate You get a  fever Worsening back pain occurs You see blood in your urine You feel sick to your stomach or throw up You feel like you are going to pass out  MAKE SURE YOU  Understand these instructions. Will watch your condition. Will get help right away if you are not doing well or get worse.   Thank you for choosing an e-visit.  Your e-visit answers were reviewed by a board certified advanced clinical practitioner to complete your personal care plan. Depending upon the condition, your plan could have included both over the counter or prescription medications.  Please review your pharmacy choice. Make sure the pharmacy is open so you can pick up prescription now. If there is a problem, you may contact  your provider through MyChart messaging and have the prescription routed to another pharmacy.  Your safety is important to us. If you have drug allergies check your prescription carefully.   For the next 24 hours you can use MyChart to ask questions about today's visit, request a non-urgent call back, or ask for a work or school excuse. You will get an email in the next two days asking about your experience. I hope that your e-visit has been valuable and will speed your recovery.   have provided 5 minutes of non face to face time during this encounter for chart review and documentation.    

## 2022-12-19 ENCOUNTER — Other Ambulatory Visit (HOSPITAL_COMMUNITY): Payer: Self-pay

## 2022-12-19 DIAGNOSIS — Z6823 Body mass index (BMI) 23.0-23.9, adult: Secondary | ICD-10-CM | POA: Diagnosis not present

## 2022-12-19 DIAGNOSIS — Z124 Encounter for screening for malignant neoplasm of cervix: Secondary | ICD-10-CM | POA: Diagnosis not present

## 2022-12-19 DIAGNOSIS — N8003 Adenomyosis of the uterus: Secondary | ICD-10-CM | POA: Diagnosis not present

## 2022-12-19 DIAGNOSIS — Z01419 Encounter for gynecological examination (general) (routine) without abnormal findings: Secondary | ICD-10-CM | POA: Diagnosis not present

## 2022-12-19 MED ORDER — METHOCARBAMOL 750 MG PO TABS
750.0000 mg | ORAL_TABLET | Freq: Every day | ORAL | 3 refills | Status: AC | PRN
  Filled 2022-12-19 – 2023-04-25 (×2): qty 30, 30d supply, fill #0
  Filled 2023-11-23 – 2023-12-10 (×2): qty 30, 30d supply, fill #1

## 2022-12-29 ENCOUNTER — Other Ambulatory Visit (HOSPITAL_COMMUNITY): Payer: Self-pay

## 2023-02-12 DIAGNOSIS — Z0000 Encounter for general adult medical examination without abnormal findings: Secondary | ICD-10-CM | POA: Diagnosis not present

## 2023-02-12 DIAGNOSIS — Z23 Encounter for immunization: Secondary | ICD-10-CM | POA: Diagnosis not present

## 2023-02-12 DIAGNOSIS — Z13220 Encounter for screening for lipoid disorders: Secondary | ICD-10-CM | POA: Diagnosis not present

## 2023-02-12 DIAGNOSIS — Z1159 Encounter for screening for other viral diseases: Secondary | ICD-10-CM | POA: Diagnosis not present

## 2023-02-12 DIAGNOSIS — J4520 Mild intermittent asthma, uncomplicated: Secondary | ICD-10-CM | POA: Diagnosis not present

## 2023-02-12 DIAGNOSIS — J309 Allergic rhinitis, unspecified: Secondary | ICD-10-CM | POA: Diagnosis not present

## 2023-02-12 DIAGNOSIS — D500 Iron deficiency anemia secondary to blood loss (chronic): Secondary | ICD-10-CM | POA: Diagnosis not present

## 2023-02-20 ENCOUNTER — Other Ambulatory Visit (HOSPITAL_COMMUNITY): Payer: Self-pay

## 2023-02-20 DIAGNOSIS — F902 Attention-deficit hyperactivity disorder, combined type: Secondary | ICD-10-CM | POA: Diagnosis not present

## 2023-02-20 DIAGNOSIS — F419 Anxiety disorder, unspecified: Secondary | ICD-10-CM | POA: Diagnosis not present

## 2023-02-20 MED ORDER — AMPHETAMINE-DEXTROAMPHET ER 10 MG PO CP24
10.0000 mg | ORAL_CAPSULE | Freq: Every morning | ORAL | 0 refills | Status: DC
  Filled 2023-02-20: qty 30, 30d supply, fill #0

## 2023-02-20 MED ORDER — AMPHETAMINE-DEXTROAMPHETAMINE 10 MG PO TABS
10.0000 mg | ORAL_TABLET | Freq: Every day | ORAL | 0 refills | Status: DC
  Filled 2023-02-20: qty 30, 30d supply, fill #0

## 2023-03-19 DIAGNOSIS — H5203 Hypermetropia, bilateral: Secondary | ICD-10-CM | POA: Diagnosis not present

## 2023-03-20 ENCOUNTER — Telehealth: Payer: Commercial Managed Care - PPO | Admitting: Family Medicine

## 2023-03-20 ENCOUNTER — Other Ambulatory Visit (HOSPITAL_COMMUNITY): Payer: Self-pay

## 2023-03-20 ENCOUNTER — Other Ambulatory Visit: Payer: Self-pay

## 2023-03-20 DIAGNOSIS — R3989 Other symptoms and signs involving the genitourinary system: Secondary | ICD-10-CM | POA: Diagnosis not present

## 2023-03-20 DIAGNOSIS — F419 Anxiety disorder, unspecified: Secondary | ICD-10-CM | POA: Diagnosis not present

## 2023-03-20 DIAGNOSIS — F902 Attention-deficit hyperactivity disorder, combined type: Secondary | ICD-10-CM | POA: Diagnosis not present

## 2023-03-20 MED ORDER — AMPHETAMINE-DEXTROAMPHET ER 10 MG PO CP24
10.0000 mg | ORAL_CAPSULE | Freq: Every morning | ORAL | 0 refills | Status: AC
  Filled 2023-05-15: qty 30, 30d supply, fill #0

## 2023-03-20 MED ORDER — AMPHETAMINE-DEXTROAMPHETAMINE 10 MG PO TABS: 10.0000 mg | ORAL_TABLET | Freq: Every day | ORAL | 0 refills | Status: AC

## 2023-03-20 MED ORDER — AMPHETAMINE-DEXTROAMPHETAMINE 10 MG PO TABS
10.0000 mg | ORAL_TABLET | Freq: Every day | ORAL | 0 refills | Status: AC
  Filled 2023-05-15: qty 30, 30d supply, fill #0

## 2023-03-20 MED ORDER — AMPHETAMINE-DEXTROAMPHET ER 10 MG PO CP24
10.0000 mg | ORAL_CAPSULE | Freq: Every morning | ORAL | 0 refills | Status: AC
  Filled 2023-03-20: qty 30, 30d supply, fill #0

## 2023-03-20 MED ORDER — AMPHETAMINE-DEXTROAMPHET ER 10 MG PO CP24: 10.0000 mg | ORAL_CAPSULE | Freq: Every morning | ORAL | 0 refills | Status: AC

## 2023-03-20 MED ORDER — AMPHETAMINE-DEXTROAMPHETAMINE 10 MG PO TABS
10.0000 mg | ORAL_TABLET | Freq: Every day | ORAL | 0 refills | Status: AC
  Filled 2023-03-20: qty 30, 30d supply, fill #0

## 2023-03-20 MED ORDER — NITROFURANTOIN MONOHYD MACRO 100 MG PO CAPS
100.0000 mg | ORAL_CAPSULE | Freq: Two times a day (BID) | ORAL | 0 refills | Status: AC
Start: 2023-03-20 — End: 2023-03-25
  Filled 2023-03-20: qty 10, 5d supply, fill #0

## 2023-03-20 NOTE — Progress Notes (Signed)

## 2023-03-29 ENCOUNTER — Encounter: Payer: Self-pay | Admitting: Pharmacist

## 2023-03-29 ENCOUNTER — Other Ambulatory Visit: Payer: Self-pay

## 2023-03-30 ENCOUNTER — Other Ambulatory Visit (HOSPITAL_COMMUNITY): Payer: Self-pay

## 2023-03-30 DIAGNOSIS — R300 Dysuria: Secondary | ICD-10-CM | POA: Diagnosis not present

## 2023-03-30 MED ORDER — SULFAMETHOXAZOLE-TRIMETHOPRIM 800-160 MG PO TABS
1.0000 | ORAL_TABLET | Freq: Two times a day (BID) | ORAL | 0 refills | Status: AC
  Filled 2023-03-30: qty 10, 5d supply, fill #0

## 2023-03-30 MED ORDER — PHENAZOPYRIDINE HCL 200 MG PO TABS
200.0000 mg | ORAL_TABLET | Freq: Three times a day (TID) | ORAL | 0 refills | Status: AC
  Filled 2023-03-30: qty 6, 2d supply, fill #0

## 2023-04-02 ENCOUNTER — Other Ambulatory Visit (HOSPITAL_COMMUNITY): Payer: Self-pay

## 2023-04-02 MED ORDER — FLUCONAZOLE 150 MG PO TABS
150.0000 mg | ORAL_TABLET | ORAL | 1 refills | Status: AC
  Filled 2023-04-02: qty 2, 2d supply, fill #0

## 2023-04-03 ENCOUNTER — Other Ambulatory Visit: Payer: Self-pay

## 2023-04-25 ENCOUNTER — Other Ambulatory Visit (HOSPITAL_COMMUNITY): Payer: Self-pay

## 2023-05-15 ENCOUNTER — Other Ambulatory Visit (HOSPITAL_COMMUNITY): Payer: Self-pay

## 2023-05-15 ENCOUNTER — Other Ambulatory Visit: Payer: Self-pay

## 2023-06-12 ENCOUNTER — Other Ambulatory Visit: Payer: Self-pay

## 2023-06-12 ENCOUNTER — Other Ambulatory Visit (HOSPITAL_COMMUNITY): Payer: Self-pay

## 2023-06-12 DIAGNOSIS — F419 Anxiety disorder, unspecified: Secondary | ICD-10-CM | POA: Diagnosis not present

## 2023-06-12 DIAGNOSIS — F902 Attention-deficit hyperactivity disorder, combined type: Secondary | ICD-10-CM | POA: Diagnosis not present

## 2023-06-12 MED ORDER — AMPHETAMINE-DEXTROAMPHET ER 15 MG PO CP24
15.0000 mg | ORAL_CAPSULE | Freq: Every morning | ORAL | 0 refills | Status: AC
  Filled 2023-08-02: qty 30, 30d supply, fill #0

## 2023-06-12 MED ORDER — AMPHETAMINE-DEXTROAMPHETAMINE 10 MG PO TABS
10.0000 mg | ORAL_TABLET | Freq: Every day | ORAL | 0 refills | Status: AC
  Filled 2023-10-17: qty 30, 30d supply, fill #0

## 2023-06-12 MED ORDER — AMPHETAMINE-DEXTROAMPHETAMINE 10 MG PO TABS
10.0000 mg | ORAL_TABLET | Freq: Every day | ORAL | 0 refills | Status: AC
  Filled 2023-11-23: qty 30, 30d supply, fill #0

## 2023-06-12 MED ORDER — AMPHETAMINE-DEXTROAMPHETAMINE 10 MG PO TABS
10.0000 mg | ORAL_TABLET | Freq: Every day | ORAL | 0 refills | Status: AC
  Filled 2023-08-02: qty 30, 30d supply, fill #0

## 2023-06-12 MED ORDER — AMPHETAMINE-DEXTROAMPHET ER 15 MG PO CP24
15.0000 mg | ORAL_CAPSULE | Freq: Every morning | ORAL | 0 refills | Status: AC
  Filled 2023-10-17: qty 30, 30d supply, fill #0

## 2023-06-12 MED ORDER — AMPHETAMINE-DEXTROAMPHET ER 15 MG PO CP24
15.0000 mg | ORAL_CAPSULE | Freq: Every morning | ORAL | 0 refills | Status: AC
  Filled 2023-06-12: qty 30, 30d supply, fill #0

## 2023-08-02 ENCOUNTER — Other Ambulatory Visit (HOSPITAL_COMMUNITY): Payer: Self-pay

## 2023-09-13 ENCOUNTER — Other Ambulatory Visit (HOSPITAL_COMMUNITY): Payer: Self-pay

## 2023-09-13 DIAGNOSIS — F419 Anxiety disorder, unspecified: Secondary | ICD-10-CM | POA: Diagnosis not present

## 2023-09-13 DIAGNOSIS — F5105 Insomnia due to other mental disorder: Secondary | ICD-10-CM | POA: Diagnosis not present

## 2023-09-13 DIAGNOSIS — F902 Attention-deficit hyperactivity disorder, combined type: Secondary | ICD-10-CM | POA: Diagnosis not present

## 2023-09-13 MED ORDER — AMPHETAMINE-DEXTROAMPHETAMINE 10 MG PO TABS: 10.0000 mg | ORAL_TABLET | Freq: Every day | ORAL | 0 refills | Status: AC

## 2023-09-13 MED ORDER — AMPHETAMINE-DEXTROAMPHET ER 15 MG PO CP24
15.0000 mg | ORAL_CAPSULE | Freq: Every morning | ORAL | 0 refills | Status: AC
  Filled 2023-11-23 – 2023-12-10 (×2): qty 30, 30d supply, fill #0

## 2023-09-13 MED ORDER — AMPHETAMINE-DEXTROAMPHET ER 15 MG PO CP24: 15.0000 mg | ORAL_CAPSULE | Freq: Every morning | ORAL | 0 refills | Status: AC

## 2023-09-13 MED ORDER — AMPHETAMINE-DEXTROAMPHET ER 15 MG PO CP24
15.0000 mg | ORAL_CAPSULE | Freq: Every morning | ORAL | 0 refills | Status: AC
  Filled 2023-09-13: qty 30, 30d supply, fill #0

## 2023-09-13 MED ORDER — AMPHETAMINE-DEXTROAMPHETAMINE 10 MG PO TABS
10.0000 mg | ORAL_TABLET | Freq: Every day | ORAL | 0 refills | Status: AC
  Filled 2023-09-13: qty 30, 30d supply, fill #0

## 2023-10-17 ENCOUNTER — Other Ambulatory Visit (HOSPITAL_COMMUNITY): Payer: Self-pay

## 2023-10-17 ENCOUNTER — Other Ambulatory Visit: Payer: Self-pay

## 2023-11-23 ENCOUNTER — Other Ambulatory Visit (HOSPITAL_COMMUNITY): Payer: Self-pay

## 2023-12-04 ENCOUNTER — Other Ambulatory Visit (HOSPITAL_COMMUNITY): Payer: Self-pay

## 2023-12-10 ENCOUNTER — Other Ambulatory Visit (HOSPITAL_COMMUNITY): Payer: Self-pay

## 2023-12-12 ENCOUNTER — Other Ambulatory Visit (HOSPITAL_COMMUNITY): Payer: Self-pay

## 2023-12-12 DIAGNOSIS — F5105 Insomnia due to other mental disorder: Secondary | ICD-10-CM | POA: Diagnosis not present

## 2023-12-12 DIAGNOSIS — F419 Anxiety disorder, unspecified: Secondary | ICD-10-CM | POA: Diagnosis not present

## 2023-12-12 DIAGNOSIS — F902 Attention-deficit hyperactivity disorder, combined type: Secondary | ICD-10-CM | POA: Diagnosis not present

## 2023-12-12 MED ORDER — AMPHETAMINE-DEXTROAMPHETAMINE 10 MG PO TABS
10.0000 mg | ORAL_TABLET | Freq: Every day | ORAL | 0 refills | Status: AC
  Filled 2023-12-12 – 2024-01-24 (×2): qty 90, 90d supply, fill #0

## 2023-12-12 MED ORDER — AMPHETAMINE-DEXTROAMPHET ER 15 MG PO CP24
15.0000 mg | ORAL_CAPSULE | Freq: Every morning | ORAL | 0 refills | Status: AC
  Filled 2024-01-24: qty 90, 90d supply, fill #0

## 2023-12-24 ENCOUNTER — Other Ambulatory Visit (HOSPITAL_COMMUNITY): Payer: Self-pay

## 2024-01-24 ENCOUNTER — Other Ambulatory Visit (HOSPITAL_COMMUNITY): Payer: Self-pay

## 2024-01-24 DIAGNOSIS — N760 Acute vaginitis: Secondary | ICD-10-CM | POA: Diagnosis not present

## 2024-01-24 DIAGNOSIS — Z309 Encounter for contraceptive management, unspecified: Secondary | ICD-10-CM | POA: Diagnosis not present

## 2024-01-24 DIAGNOSIS — N8003 Adenomyosis of the uterus: Secondary | ICD-10-CM | POA: Diagnosis not present

## 2024-01-24 DIAGNOSIS — N939 Abnormal uterine and vaginal bleeding, unspecified: Secondary | ICD-10-CM | POA: Diagnosis not present

## 2024-01-24 DIAGNOSIS — Z01419 Encounter for gynecological examination (general) (routine) without abnormal findings: Secondary | ICD-10-CM | POA: Diagnosis not present

## 2024-01-24 DIAGNOSIS — Z1151 Encounter for screening for human papillomavirus (HPV): Secondary | ICD-10-CM | POA: Diagnosis not present

## 2024-01-24 DIAGNOSIS — Z6824 Body mass index (BMI) 24.0-24.9, adult: Secondary | ICD-10-CM | POA: Diagnosis not present

## 2024-01-24 DIAGNOSIS — Z124 Encounter for screening for malignant neoplasm of cervix: Secondary | ICD-10-CM | POA: Diagnosis not present

## 2024-02-28 ENCOUNTER — Other Ambulatory Visit (HOSPITAL_COMMUNITY): Payer: Self-pay

## 2024-02-28 MED ORDER — DOXYCYCLINE HYCLATE 100 MG PO TABS
100.0000 mg | ORAL_TABLET | Freq: Two times a day (BID) | ORAL | 0 refills | Status: AC
  Filled 2024-02-28: qty 14, 7d supply, fill #0

## 2024-03-12 ENCOUNTER — Other Ambulatory Visit (HOSPITAL_COMMUNITY): Payer: Self-pay

## 2024-03-12 MED ORDER — PROPRANOLOL HCL 10 MG PO TABS
10.0000 mg | ORAL_TABLET | Freq: Two times a day (BID) | ORAL | 1 refills | Status: AC | PRN
  Filled 2024-03-12: qty 60, 30d supply, fill #0

## 2024-03-12 MED ORDER — AMPHETAMINE-DEXTROAMPHETAMINE 10 MG PO TABS: 10.0000 mg | ORAL_TABLET | Freq: Every day | ORAL | 0 refills | Status: AC

## 2024-03-12 MED ORDER — AMPHETAMINE-DEXTROAMPHET ER 15 MG PO CP24: 15.0000 mg | ORAL_CAPSULE | Freq: Every morning | ORAL | 0 refills | Status: AC
